# Patient Record
Sex: Male | Born: 1970
Health system: Southern US, Community
[De-identification: ages and names within clinical notes are randomized; demographics above are authoritative.]

## PROBLEM LIST (undated history)

## (undated) DIAGNOSIS — I10 Essential (primary) hypertension: Secondary | ICD-10-CM

## (undated) DIAGNOSIS — Z0000 Encounter for general adult medical examination without abnormal findings: Secondary | ICD-10-CM

## (undated) DIAGNOSIS — N529 Male erectile dysfunction, unspecified: Secondary | ICD-10-CM

## (undated) DIAGNOSIS — R9431 Abnormal electrocardiogram [ECG] [EKG]: Secondary | ICD-10-CM

## (undated) DIAGNOSIS — F1020 Alcohol dependence, uncomplicated: Secondary | ICD-10-CM

## (undated) DIAGNOSIS — N138 Other obstructive and reflux uropathy: Secondary | ICD-10-CM

## (undated) DIAGNOSIS — N401 Enlarged prostate with lower urinary tract symptoms: Secondary | ICD-10-CM

## (undated) HISTORY — DX: Essential (primary) hypertension: I10

## (undated) HISTORY — DX: Encounter for general adult medical examination without abnormal findings: Z00.00

## (undated) HISTORY — DX: Other obstructive and reflux uropathy: N13.8

## (undated) HISTORY — DX: Alcohol dependence, uncomplicated: F10.20

## (undated) HISTORY — DX: Abnormal electrocardiogram (ECG) (EKG): R94.31

## (undated) HISTORY — DX: Benign prostatic hyperplasia with lower urinary tract symptoms: N40.1

## (undated) HISTORY — DX: Male erectile dysfunction, unspecified: N52.9

---

## 2002-03-08 ENCOUNTER — Emergency Department (HOSPITAL_COMMUNITY): Admission: EM | Admit: 2002-03-08 | Discharge: 2002-03-08 | Payer: Self-pay | Admitting: Emergency Medicine

## 2008-06-06 ENCOUNTER — Emergency Department (HOSPITAL_COMMUNITY): Admission: EM | Admit: 2008-06-06 | Discharge: 2008-06-06 | Payer: Self-pay | Admitting: Emergency Medicine

## 2009-02-17 ENCOUNTER — Emergency Department (HOSPITAL_COMMUNITY): Admission: EM | Admit: 2009-02-17 | Discharge: 2009-02-17 | Payer: Self-pay | Admitting: Family Medicine

## 2009-04-19 ENCOUNTER — Ambulatory Visit: Payer: Self-pay | Admitting: Internal Medicine

## 2009-04-19 DIAGNOSIS — F528 Other sexual dysfunction not due to a substance or known physiological condition: Secondary | ICD-10-CM | POA: Insufficient documentation

## 2009-04-19 DIAGNOSIS — I10 Essential (primary) hypertension: Secondary | ICD-10-CM | POA: Insufficient documentation

## 2009-04-19 DIAGNOSIS — J45909 Unspecified asthma, uncomplicated: Secondary | ICD-10-CM | POA: Insufficient documentation

## 2009-06-24 ENCOUNTER — Ambulatory Visit: Payer: Self-pay | Admitting: Internal Medicine

## 2009-07-08 ENCOUNTER — Ambulatory Visit: Payer: Self-pay | Admitting: Internal Medicine

## 2009-07-08 DIAGNOSIS — N401 Enlarged prostate with lower urinary tract symptoms: Secondary | ICD-10-CM | POA: Insufficient documentation

## 2009-07-08 LAB — CONVERTED CEMR LAB
Albumin: 4.2 g/dL (ref 3.5–5.2)
Basophils Relative: 0.6 % (ref 0.0–3.0)
CO2: 29 meq/L (ref 19–32)
Chloride: 104 meq/L (ref 96–112)
Eosinophils Absolute: 0.2 10*3/uL (ref 0.0–0.7)
Eosinophils Relative: 5 % (ref 0.0–5.0)
GFR calc non Af Amer: 137.05 mL/min (ref >60)
Glucose, Bld: 119 mg/dL — ABNORMAL HIGH (ref 70–99)
HCT: 44.6 % (ref 39.0–52.0)
Hemoglobin, Urine: NEGATIVE
Ketones, ur: NEGATIVE mg/dL
LDL Cholesterol: 118 mg/dL — ABNORMAL HIGH (ref 0–99)
Leukocytes, UA: NEGATIVE
Lymphocytes Relative: 21.7 % (ref 12.0–46.0)
MCHC: 32.7 g/dL (ref 30.0–36.0)
Neutro Abs: 3.2 10*3/uL (ref 1.4–7.7)
Nitrite: NEGATIVE
RBC: 4.47 M/uL (ref 4.22–5.81)
TSH: 0.85 microintl units/mL (ref 0.35–5.50)
Total CHOL/HDL Ratio: 3
Total Protein, Urine: NEGATIVE mg/dL
Urine Glucose: NEGATIVE mg/dL
Urobilinogen, UA: 1 (ref 0.0–1.0)
pH: 7 (ref 5.0–8.0)

## 2009-09-16 ENCOUNTER — Emergency Department (HOSPITAL_COMMUNITY): Admission: EM | Admit: 2009-09-16 | Discharge: 2009-09-16 | Payer: Self-pay | Admitting: Emergency Medicine

## 2009-09-16 ENCOUNTER — Encounter: Payer: Self-pay | Admitting: Internal Medicine

## 2009-09-20 ENCOUNTER — Ambulatory Visit: Payer: Self-pay | Admitting: Internal Medicine

## 2009-09-20 ENCOUNTER — Telehealth: Payer: Self-pay | Admitting: Internal Medicine

## 2009-09-20 ENCOUNTER — Telehealth (INDEPENDENT_AMBULATORY_CARE_PROVIDER_SITE_OTHER): Payer: Self-pay | Admitting: *Deleted

## 2009-09-20 DIAGNOSIS — F1020 Alcohol dependence, uncomplicated: Secondary | ICD-10-CM | POA: Insufficient documentation

## 2009-09-20 DIAGNOSIS — R9431 Abnormal electrocardiogram [ECG] [EKG]: Secondary | ICD-10-CM | POA: Insufficient documentation

## 2009-09-22 ENCOUNTER — Encounter (INDEPENDENT_AMBULATORY_CARE_PROVIDER_SITE_OTHER): Payer: Self-pay | Admitting: *Deleted

## 2009-10-05 ENCOUNTER — Ambulatory Visit: Payer: Self-pay | Admitting: Cardiovascular Disease

## 2009-10-18 ENCOUNTER — Ambulatory Visit (HOSPITAL_COMMUNITY): Admission: RE | Admit: 2009-10-18 | Discharge: 2009-10-18 | Payer: Self-pay | Admitting: Cardiovascular Disease

## 2009-10-18 ENCOUNTER — Encounter: Payer: Self-pay | Admitting: Cardiovascular Disease

## 2009-10-18 ENCOUNTER — Ambulatory Visit: Payer: Self-pay

## 2009-10-18 ENCOUNTER — Ambulatory Visit: Payer: Self-pay | Admitting: Internal Medicine

## 2009-11-04 ENCOUNTER — Encounter (INDEPENDENT_AMBULATORY_CARE_PROVIDER_SITE_OTHER): Payer: Self-pay | Admitting: *Deleted

## 2009-11-25 ENCOUNTER — Ambulatory Visit: Payer: Self-pay | Admitting: Internal Medicine

## 2010-03-30 ENCOUNTER — Emergency Department (HOSPITAL_COMMUNITY): Admission: EM | Admit: 2010-03-30 | Discharge: 2010-03-30 | Payer: Self-pay | Admitting: Family Medicine

## 2010-07-14 NOTE — Assessment & Plan Note (Signed)
Summary: CPX/ WILL COME FASTING / NWS #   Vital Signs:  Patient profile:   40 year old male Height:      71 inches Weight:      195 pounds O2 Sat:      97 % on Room air Temp:     98.0 degrees F oral Pulse rate:   80 / minute Pulse rhythm:   regular Resp:     16 per minute BP sitting:   132 / 80  (right arm)  O2 Flow:  Room air  Primary Care Provider:  Etta Grandchild MD   History of Present Illness: He returns for f/up and feels well with  no complaints. Viagra is working well for  his ED.  Hypertension History:      He complains of side effects from treatment, but denies headache, chest pain, palpitations, dyspnea with exertion, orthopnea, PND, peripheral edema, visual symptoms, neurologic problems, and syncope.  He notes the following problems with antihypertensive medication side effects: ED.        Positive major cardiovascular risk factors include hypertension.  Negative major cardiovascular risk factors include male age less than 45 years old, no history of diabetes or hyperlipidemia, negative family history for ischemic heart disease, and non-tobacco-user status.        Further assessment for target organ damage reveals no history of ASHD, cardiac end-organ damage (CHF/LVH), stroke/TIA, peripheral vascular disease, renal insufficiency, or hypertensive retinopathy.     Allergies: 1)  ! Ace Inhibitors  Past History:  Past Medical History: Reviewed history from 04/19/2009 and no changes required. Asthma Hypertension ED Glaucoma  Past Surgical History: Reviewed history from 04/19/2009 and no changes required. Denies surgical history  Family History: Reviewed history from 04/19/2009 and no changes required. Family History Hypertension Family History of Stroke F 1st degree relative <60  Social History: Reviewed history from 04/19/2009 and no changes required. Occupation: Cook at [employer] Never Smoked Alcohol use-yes Drug use-no Regular exercise-yes  Review  of Systems       The patient complains of weight gain.  The patient denies anorexia, fever, weight loss, abdominal pain, melena, hematochezia, severe indigestion/heartburn, hematuria, difficulty walking, depression, enlarged lymph nodes, and testicular masses.   GU:  Complains of erectile dysfunction; denies decreased libido, discharge, dysuria, hematuria, incontinence, nocturia, urinary frequency, and urinary hesitancy.  Physical Exam  General:  alert, well-developed, well-nourished, well-hydrated, appropriate dress, normal appearance, healthy-appearing, cooperative to examination, good hygiene, and overweight-appearing.   Head:  normocephalic and atraumatic.   Eyes:  vision grossly intact, pupils equal, pupils round, and pupils reactive to light.   Mouth:  Oral mucosa and oropharynx without lesions or exudates.  Teeth in good repair. Neck:  supple, full ROM, no masses, no thyromegaly, no carotid bruits, and no cervical lymphadenopathy.   Chest Wall:  No deformities, masses, tenderness or gynecomastia noted. Breasts:  No masses or gynecomastia noted Lungs:  normal respiratory effort, no intercostal retractions, no accessory muscle use, normal breath sounds, no crackles, and no wheezes.   Heart:  normal rate, regular rhythm, no murmur, no gallop, and no rub.   Abdomen:  soft, non-tender, normal bowel sounds, no distention, no masses, no hepatomegaly, and no splenomegaly.   Rectal:  No external abnormalities noted. Normal sphincter tone. No rectal masses or tenderness. heme negative stool. Genitalia:  circumcised, no hydrocele, no varicocele, no scrotal masses, no testicular masses or atrophy, no cutaneous lesions, and no urethral discharge.   Prostate:  no nodules, no  asymmetry, no induration, and 1+ enlarged.   Msk:  normal ROM, no joint tenderness, no joint swelling, no joint warmth, no redness over joints, no joint deformities, no joint instability, and no crepitation.   Pulses:  R and L  carotid,radial,femoral,dorsalis pedis and posterior tibial pulses are full and equal bilaterally Extremities:  No clubbing, cyanosis, edema, or deformity noted with normal full range of motion of all joints.   Neurologic:  No cranial nerve deficits noted. Station and gait are normal. Plantar reflexes are down-going bilaterally. DTRs are symmetrical throughout. Sensory, motor and coordinative functions appear intact. Skin:  turgor normal, color normal, no rashes, no suspicious lesions, no ecchymoses, no petechiae, no purpura, no ulcerations, and no edema.   Cervical Nodes:  No lymphadenopathy noted Axillary Nodes:  no R axillary adenopathy and no L axillary adenopathy.   Inguinal Nodes:  no R inguinal adenopathy and no L inguinal adenopathy.   Psych:  Cognition and judgment appear intact. Alert and cooperative with normal attention span and concentration. No apparent delusions, illusions, hallucinations Additional Exam:  EKG shows NSR with minimal LVH c/w his hx. of hypertension but there are no Q waves and no ST/T wave changes.   Impression & Recommendations:  Problem # 1:  HYPERTROPHY PROSTATE W/UR OBST & OTH LUTS (ICD-600.01) Assessment New  Orders: Venipuncture (52841) TLB-Lipid Panel (80061-LIPID) TLB-BMP (Basic Metabolic Panel-BMET) (80048-METABOL) TLB-CBC Platelet - w/Differential (85025-CBCD) TLB-Hepatic/Liver Function Pnl (80076-HEPATIC) TLB-TSH (Thyroid Stimulating Hormone) (84443-TSH) TLB-Udip w/ Micro (81001-URINE) TLB-PSA (Prostate Specific Antigen) (84153-PSA)  Problem # 2:  ROUTINE GENERAL MEDICAL EXAM@HEALTH  CARE FACL (ICD-V70.0) Assessment: New  Orders: Venipuncture (32440) TLB-Lipid Panel (80061-LIPID) TLB-BMP (Basic Metabolic Panel-BMET) (80048-METABOL) TLB-CBC Platelet - w/Differential (85025-CBCD) TLB-Hepatic/Liver Function Pnl (80076-HEPATIC) TLB-TSH (Thyroid Stimulating Hormone) (84443-TSH) TLB-Udip w/ Micro (81001-URINE) TLB-PSA (Prostate Specific Antigen)  (84153-PSA) EKG w/ Interpretation (93000)  Discussed  use of alcohol, drug use, self testicular exam, routine dental care, routine eye care, routine physical exam, seat belts, multiple vitamins,  and recommendations for immunizations.  Discussed exercise and checking cholesterol.  Discussed gun safety, safe sex, and contraception. Also recommend checking PSA.  Problem # 3:  ERECTILE DYSFUNCTION (ICD-302.72) Assessment: Improved  His updated medication list for this problem includes:    Viagra 50 Mg Tabs (Sildenafil citrate) .Marland Kitchen... Take as directed  Orders: Venipuncture (10272) TLB-Lipid Panel (80061-LIPID) TLB-BMP (Basic Metabolic Panel-BMET) (80048-METABOL) TLB-CBC Platelet - w/Differential (85025-CBCD) TLB-Hepatic/Liver Function Pnl (80076-HEPATIC) TLB-TSH (Thyroid Stimulating Hormone) (84443-TSH) TLB-Udip w/ Micro (81001-URINE) TLB-PSA (Prostate Specific Antigen) (84153-PSA)  Discussed proper use of medications, as well as side effects.   Problem # 4:  HYPERTENSION (ICD-401.9) Assessment: Improved  His updated medication list for this problem includes:    Azor 5-20 Mg Tabs (Amlodipine-olmesartan) ..... Once daily for high blood pressure  Orders: Venipuncture (53664) TLB-Lipid Panel (80061-LIPID) TLB-BMP (Basic Metabolic Panel-BMET) (80048-METABOL) TLB-CBC Platelet - w/Differential (85025-CBCD) TLB-Hepatic/Liver Function Pnl (80076-HEPATIC) TLB-TSH (Thyroid Stimulating Hormone) (84443-TSH) TLB-Udip w/ Micro (81001-URINE) TLB-PSA (Prostate Specific Antigen) (84153-PSA) EKG w/ Interpretation (93000)  Complete Medication List: 1)  Azor 5-20 Mg Tabs (Amlodipine-olmesartan) .... Once daily for high blood pressure 2)  Viagra 50 Mg Tabs (Sildenafil citrate) .... Take as directed  Hypertension Assessment/Plan:      The patient's hypertensive risk group is category A: No risk factors and no target organ damage.  Today's blood pressure is 132/80.  His blood pressure goal is <  140/90.  Patient Instructions: 1)  Please schedule a follow-up appointment in 3 months. 2)  It is important that  you exercise regularly at least 20 minutes 5 times a week. If you develop chest pain, have severe difficulty breathing, or feel very tired , stop exercising immediately and seek medical attention. 3)  You need to lose weight. Consider a lower calorie diet and regular exercise.  4)  Check your Blood Pressure regularly. If it is above 140/90: you should make an appointment. Prescriptions: AZOR 5-20 MG TABS (AMLODIPINE-OLMESARTAN) once daily for high blood pressure  #30 x 11   Entered and Authorized by:   Etta Grandchild MD   Signed by:   Etta Grandchild MD on 07/08/2009   Method used:   [facility]ically to        [facility] [address] [phone]* (retail)       [address]       [place], [place]  [address]       Ph: [phone] or [phone]       Fax: [phone]   RxID:   [id number] VIAGRA 50 MG TABS (SILDENAFIL CITRATE) take as directed  #10 x 11   Entered and Authorized by:   Etta Grandchild MD   Signed by:   Etta Grandchild MD on 07/08/2009   Method used:   [facility]        [facility] [address] [phone]* (retail)       [address]       [place], [place]  [address]       Ph: [phone] or [phone]       Fax: [phone]   RxID:   [id number]

## 2010-07-14 NOTE — Progress Notes (Signed)
  Phone Note Call from Patient   Caller: Patient Summary of Call: Patient request generic allegra rx, states that this was discussed at [facility] visit today. Please advise Initial call taken by: Rock Nephew CMA,  September 20, 2009 3:07 PM  Follow-up for Phone Call        done Follow-up by: Etta Grandchild MD,  September 20, 2009 3:33 PM  Additional Follow-up for Phone Call Additional follow up Details #1::        Called home number// not able to leave VM due to phone off or pt out of service area. Called other number/ line busy x 3.Alvy Beal Archie CMA  September 20, 2009 3:39 PM    Additional Follow-up by: Rock Nephew CMA,  September 22, 2009 11:23 AM    New/Updated Medications: FEXOFENADINE HCL 180 MG TABS (FEXOFENADINE HCL) One by mouth once daily Prescriptions: FEXOFENADINE HCL 180 MG TABS (FEXOFENADINE HCL) One by mouth once daily  #30 x 11   Entered and Authorized by:   Etta Grandchild MD   Signed by:   Etta Grandchild MD on 09/20/2009   Method used:   [facility]ronically to        [facility] [address] [phone]* (retail)       [address]       [place], [place]  [address]       Ph: [phone] or [phone]       Fax: [phone]   RxID:   [id number]

## 2010-07-14 NOTE — Assessment & Plan Note (Signed)
Summary: 2 month follow up-lb   Vital Signs:  Patient profile:   40 year old male Height:      72 inches Weight:      203 pounds BMI:     27.63 O2 Sat:      97 % on Room air Temp:     98.8 degrees F oral Pulse rate:   90 / minute Pulse rhythm:   regular Resp:     16 per minute BP sitting:   128 / 86  (left arm) Cuff size:   large  Vitals Entered By: Rock Nephew CMA (November 25, 2009 9:07 AM)  Nutrition Counseling: Patient's BMI is greater than 25 and therefore counseled on weight management options.  O2 Flow:  Room air CC: follow-up visit Is Patient Diabetic? No Pain Assessment Patient in pain? no        Primary Care Provider:  Etta Grandchild MD  CC:  follow-up visit.  History of Present Illness:  Hypertension Follow-Up      This is a 40 year old man who presents for Hypertension follow-up.  The patient denies lightheadedness, urinary frequency, headaches, edema, rash, and fatigue.  The patient denies the following associated symptoms: chest pain, chest pressure, exercise intolerance, dyspnea, palpitations, syncope, leg edema, and pedal edema.  Compliance with medications (by patient report) has been near 100%.  The patient reports that dietary compliance has been fair.  The patient reports exercising occasionally.    Preventive Screening-Counseling & Management  Alcohol-Tobacco     Alcohol drinks/day: 2     Alcohol type: all     >5/day in last 3 mos: no     Alcohol Counseling: to decrease amount and/or frequency of alcohol intake     Feels need to cut down: yes     Feels annoyed by complaints: yes     Feels guilty re: drinking: yes     Needs 'eye opener' in am: no     Smoking Status: never     Passive Smoke Exposure: no  Hep-HIV-STD-Contraception     Hepatitis Risk: no risk noted     HIV Risk: no risk noted     STD Risk: no risk noted     TSE monthly: yes      Sexual History:  currently monogamous.        Drug Use:  no.        Blood Transfusions:  no.     Clinical Review Panels:  Lipid Management   Cholesterol:  195 (07/08/2009)   LDL (bad choesterol):  118 (07/08/2009)   HDL (good cholesterol):  65.90 (07/08/2009)  Diabetes Management   Creatinine:  0.8 (07/08/2009)  CBC   WBC:  5.0 (07/08/2009)   RBC:  4.47 (07/08/2009)   Hgb:  14.6 (07/08/2009)   Hct:  44.6 (07/08/2009)   Platelets:  238.0 (07/08/2009)   MCV  99.7 (07/08/2009)   MCHC  32.7 (07/08/2009)   RDW  11.0 (07/08/2009)   PMN:  63.6 (07/08/2009)   Lymphs:  21.7 (07/08/2009)   Monos:  9.1 (07/08/2009)   Eosinophils:  5.0 (07/08/2009)   Basophil:  0.6 (07/08/2009)  Complete Metabolic Panel   Glucose:  119 (07/08/2009)   Sodium:  138 (07/08/2009)   Potassium:  4.0 (07/08/2009)   Chloride:  104 (07/08/2009)   CO2:  29 (07/08/2009)   BUN:  10 (07/08/2009)   Creatinine:  0.8 (07/08/2009)   Albumin:  4.2 (07/08/2009)   Total Protein:  8.1 (07/08/2009)   Calcium:  9.3 (07/08/2009)   Total Bili:  1.3 (07/08/2009)   Alk Phos:  47 (07/08/2009)   SGPT (ALT):  55 (07/08/2009)   SGOT (AST):  52 (07/08/2009)   Medications Prior to Update: 1)  Azor 5-20 Mg Tabs (Amlodipine-Olmesartan) .... Once Daily For High Blood Pressure 2)  Viagra 50 Mg Tabs (Sildenafil Citrate) .... Take As Directed 3)  Fexofenadine Hcl 180 Mg Tabs (Fexofenadine Hcl) .... One By Mouth Once Daily (Stopped)  Current Medications (verified): 1)  Azor 5-20 Mg Tabs (Amlodipine-Olmesartan) .... Once Daily For High Blood Pressure 2)  Viagra 50 Mg Tabs (Sildenafil Citrate) .... Take As Directed 3)  Fexofenadine Hcl 180 Mg Tabs (Fexofenadine Hcl) .... One By Mouth Once Daily (Stopped)  Allergies (verified): 1)  ! Ace Inhibitors  Past History:  Past Medical History: Last updated: 10/04/2009 Asthma Hypertension ED Glaucoma  Current Problems:  ELECTROCARDIOGRAM, ABNORMAL (ICD-794.31)  09/20/09 LVH in limb leads OTH&UNSPEC ALCOHOL DEPEND EPISODIC DRUNKENNESS (ICD-303.92) HYPERTROPHY PROSTATE  W/UR OBST & OTH LUTS (ICD-600.01) ROUTINE GENERAL MEDICAL EXAM@HEALTH  CARE FACL (ICD-V70.0) ERECTILE DYSFUNCTION (ICD-302.72) HYPERTENSION (ICD-401.9) ASTHMA (ICD-493.90)  Past Surgical History: Last updated: 04/19/2009 Denies surgical history  Family History: Last updated: 04/19/2009 Family History Hypertension Family History of Stroke F 1st degree relative <60  Social History: Last updated: 10/05/2009 Occupation: [employer]ana [employer] at [employer] Never Smoked Alcohol use-yes Drug use-no Regular exercise-no   Risk Factors: Alcohol Use: 2 (11/25/2009) >5 drinks/d w/in last 3 months: no (11/25/2009) Exercise: yes (04/19/2009)  Risk Factors: Smoking Status: never (11/25/2009) Passive Smoke Exposure: no (11/25/2009)  Family History: Reviewed history from 04/19/2009 and no changes required. Family History Hypertension Family History of Stroke F 1st degree relative <60  Social History: Reviewed history from 10/05/2009 and no changes required. 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  The patient denies anorexia, fever, weight loss, syncope, peripheral edema, abdominal pain, difficulty walking, and depression.    Physical Exam  General:  alert, well-developed, well-nourished, well-hydrated, appropriate dress, normal appearance, healthy-appearing, cooperative to examination, good hygiene, and overweight-appearing.   Mouth:  Oral mucosa and oropharynx without lesions or exudates.  Teeth in good repair. Neck:  supple, full ROM, no masses, no thyromegaly, no carotid bruits, and no cervical lymphadenopathy.   Lungs:  normal respiratory effort, no intercostal retractions, no accessory muscle use, normal breath sounds, no crackles, and no wheezes.   Heart:  normal rate, regular rhythm, no murmur, no gallop, and no rub.   Abdomen:  soft, non-tender, normal bowel sounds, no  distention, no masses, no hepatomegaly, and no splenomegaly.   Msk:  normal ROM, no joint tenderness, no joint swelling, no joint warmth, no redness over joints, no joint deformities, no joint instability, and no crepitation.   Pulses:  R and L carotid,radial,femoral,dorsalis pedis and posterior tibial pulses are full and equal bilaterally Extremities:  No clubbing, cyanosis, edema, or deformity noted with normal full range of motion of all joints.   Neurologic:  No cranial nerve deficits noted. Station and gait are normal. Plantar reflexes are down-going bilaterally. DTRs are symmetrical throughout. Sensory, motor and coordinative functions appear intact. Skin:  turgor normal, color normal, no rashes, no suspicious lesions, no ecchymoses, no petechiae, no purpura, no ulcerations, and no edema.   Psych:  Cognition and judgment appear intact. Alert and cooperative with normal attention span and concentration. No apparent delusions, illusions, hallucinations  Impression & Recommendations:  Problem # 1:  HYPERTENSION (ICD-401.9) Assessment Improved  His updated medication list for this problem includes:    Azor 5-20 Mg Tabs (Amlodipine-olmesartan) ..... Once daily for high blood pressure  BP today: 128/86 Prior BP: 148/86 (10/05/2009)  Prior 10 Yr Risk Heart Disease: 4 % (09/20/2009)  Labs Reviewed: K+: 4.0 (07/08/2009) Creat: : 0.8 (07/08/2009)   Chol: 195 (07/08/2009)   HDL: 65.90 (07/08/2009)   LDL: 118 (07/08/2009)   TG: 57.0 (07/08/2009)  Complete Medication List: 1)  Azor 5-20 Mg Tabs (Amlodipine-olmesartan) .... Once daily for high blood pressure 2)  Viagra 50 Mg Tabs (Sildenafil citrate) .... Take as directed 3)  Fexofenadine Hcl 180 Mg Tabs (Fexofenadine hcl) .... One by mouth once daily (stopped)  Patient Instructions: 1)  Please schedule a follow-up appointment in 4 months. 2)  It is important that you exercise regularly at least 20 minutes 5 times a week. If you develop  chest pain, have severe difficulty breathing, or feel very tired , stop exercising immediately and seek medical attention. 3)  You need to lose weight. Consider a lower calorie diet and regular exercise.  4)  It is not healthy  for men to drink more than 2-3 drinks per day or for women to drink more than 1-2 drinks per day. 5)  Check your Blood Pressure regularly. If it is above 130/80: you should make an appointment.

## 2010-07-14 NOTE — Letter (Signed)
Summary: [facility] Consult Scheduled Letter  [facility]  [address] [place], [place] [address]   Phone: [phone]  Fax: [phone]      09/22/2009 MRN: [id number]  Thomas Berry [address] [place], [place]  [address]    Dear [name],      We have scheduled an appointment for you.  At the recommendation of Dr.Jones, we have scheduled you a consult with Dr Eden Emms on 10/05/09 at 3:45pm.  Their phone number is [phone].  If this appointment day and time is not convenient for you, please feel free to call the office of the doctor you are being referred to at the number listed above and reschedule the appointment.    [facility] [address] *Cardiology Dept.3rd Floor*    Please give 24hr notice if you need to cancel/reschedule to avoid a $50.00 fee.Also bring insurance card and any co-pay due at time of visit.     Thank you,  Patient Care Coordinator [facility]

## 2010-07-14 NOTE — Letter (Signed)
Summary: Lipid Letter  [facility]  [address] [place], [place] [address]   Phone: [phone]  Fax: [phone]    07/08/2009  Thomas Berry [address] [place], [place]  [address]  Dear Thomas Berry:  We have carefully reviewed your last lipid profile from 07/08/2009 and the results are noted below with a summary of recommendations for lipid management.    Cholesterol:       195     Goal: <200   HDL "good" Cholesterol:   41.32     Goal: >40   LDL "bad" Cholesterol:   118     Goal: <130   Triglycerides:       57.0     Goal: <150        TLC Diet (Therapeutic Lifestyle Change): Saturated Fats & Transfatty acids should be kept < 7% of total calories ***Reduce Saturated Fats Polyunstaurated Fat can be up to 10% of total calories Monounsaturated Fat Fat can be up to 20% of total calories Total Fat should be no greater than 25-35% of total calories Carbohydrates should be 50-60% of total calories Protein should be approximately 15% of total calories Fiber should be at least 20-30 grams a day ***Increased fiber may help lower LDL Total Cholesterol should be < 200mg /day Consider adding plant stanol/sterols to diet (example: Benacol spread) ***A higher intake of unsaturated fat may reduce Triglycerides and Increase HDL    Adjunctive Measures (may lower LIPIDS and reduce risk of Heart Attack) include: Aerobic Exercise (20-30 minutes 3-4 times a week) Limit Alcohol Consumption Weight Reduction Aspirin 75-81 mg a day by mouth (if not allergic or contraindicated) Dietary Fiber 20-30 grams a day by mouth     Current Medications: 1)    Azor 5-20 Mg Tabs (Amlodipine-olmesartan) .... Once daily for high blood pressure 2)    Viagra 50 Mg Tabs (Sildenafil citrate) .... Take as directed  If you have any questions, please call. We appreciate being able to work with you.   Sincerely,    [facility] Etta Grandchild MD

## 2010-07-14 NOTE — Letter (Signed)
Summary: Results Follow-up Letter  [facility]  [address] [place], [place] [address]   Phone: [phone]  Fax: [phone]    07/08/2009  [address] [place], [place]  [address]  Dear [name],   The following are the results of your recent test(s):  Test     Result     Blood sugar     slightly high Liver enzymes   elevated Kidney     normal CBC       normal Prostate     normal Thyroid     normal Urine       normal  _________________________________________________________  Please call for an appointment in 1-2 months _________________________________________________________ _________________________________________________________ _________________________________________________________  Sincerely,  Sanda Linger MD [facility]

## 2010-07-14 NOTE — Assessment & Plan Note (Signed)
Summary: np3/abn ekg/jml   Primary Provider:  Etta Grandchild MD  CC:  Rapid HR; SOB.  History of Present Illness: Thomas Berry is seen today at the request of his primary. He was in the ER recently for palpitaotns and dyspnea.  W/U was negative and he was D/C. He has longstanding HTN and his meds were recently changed.  He has a poor diet and has too much salt and sausage.  He drinks on a daily basis as well.  He denies SSCP, syncope or edema.  His ECG was abnormal with signs of LVH>  He has not had a previous  echo.  He is sedentary.  Current Problems (verified): 1)  Electrocardiogram, Abnormal  (ICD-794.31) 2)  Oth&unspec Alcohol Depend Episodic Drunkenness  (ICD-303.92) 3)  Hypertrophy Prostate W/ur Obst & Oth Luts  (ICD-600.01) 4)  Routine General Medical Exam@[facility]  (ICD-V70.0) 5)  Erectile Dysfunction  (ICD-302.72) 6)  Hypertension  (ICD-401.9) 7)  Asthma  (ICD-493.90)  Current Medications (verified): 1)  Azor 5-20 Mg Tabs (Amlodipine-Olmesartan) .... Once Daily For High Blood Pressure 2)  Viagra 50 Mg Tabs (Sildenafil Citrate) .... Take As Directed 3)  Fexofenadine Hcl 180 Mg Tabs (Fexofenadine Hcl) .... One By Mouth Once Daily (Stopped)  Allergies: 1)  ! Ace Inhibitors  Past History:  Past Medical History: Last updated: 10/04/2009 Asthma Hypertension ED Glaucoma  Current Problems:  ELECTROCARDIOGRAM, ABNORMAL (ICD-794.31)  09/20/09 LVH in limb leads OTH&UNSPEC ALCOHOL DEPEND EPISODIC DRUNKENNESS (ICD-303.92) HYPERTROPHY PROSTATE W/UR OBST & OTH LUTS (ICD-600.01) ROUTINE GENERAL MEDICAL EXAM@[facility] (ICD-V70.0) ERECTILE DYSFUNCTION (ICD-302.72) HYPERTENSION (ICD-401.9) ASTHMA (ICD-493.90)  Past Surgical History: Last updated: 04/19/2009 Denies surgical history  Family History: Last updated: 04/19/2009 Family History Hypertension Family History of Stroke F 1st degree relative <60  Social History: Last updated: 10/05/2009 Occupation: Adriana Simas at  [employer] Never Smoked Alcohol use-yes Drug use-no Regular exercise-no   Social History: 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   Vital Signs:  Patient profile:   40 year old male Height:      72 inches Weight:      207 pounds Pulse rate:   77 / minute BP sitting:   148 / 86  (left arm) Cuff size:   regular  Vitals Entered By: Stanton Kidney, EMT-P (October 05, 2009 3:41 PM)  Physical Exam  General:  Affect appropriate Healthy:  appears stated age HEENT: normal Neck supple with no adenopathy JVP normal no bruits no thyromegaly Lungs clear with no wheezing and good diaphragmatic motion Heart:  S1/S2 no murmur,rub, gallop or click PMI normal Abdomen: benighn, BS positve, no tenderness, no AAA no bruit.  No HSM or HJR Distal pulses intact with no bruits No edema Neuro non-focal Skin warm and dry    Impression & Recommendations:  Problem # 1:  ELECTROCARDIOGRAM, ABNORMAL (ICD-794.31) LVH from poorly controlled BP  F/U echo to R/O CE, assess degree of LVH and make sure EF normal His updated medication list for this problem includes:    Azor 5-20 Mg Tabs (Amlodipine-olmesartan) ..... Once daily for high blood pressure  Orders: Echocardiogram (Echo)  Problem # 2:  HYPERTENSION (ICD-401.9) Stressed importance of improved diet and decreased ETOH and salt consumption.  Continue Azor His updated  medication list for this problem includes:    Azor 5-20 Mg Tabs (Amlodipine-olmesartan) ..... Once daily for high blood pressure  Problem # 3:  PALPITATIONS (ICD-785.1) ? Panic attack related to ETOH.  NO arrythmia on tele in ER.  Can be prone to atrial arrythmias with HTN and ETOH.  Event monitor if  recurs or echo abnormal His updated medication list for this problem includes:    Azor 5-20 Mg Tabs (Amlodipine-olmesartan) ..... Once daily for high blood pressure  Patient Instructions: 1)  Your physician recommends that you schedule a follow-up appointment in: AS NEEDED PENDING TEST RESULTS 2)  Your physician has requested that you have an echocardiogram.  Echocardiography is a painless test that uses sound waves to create images of your heart. It provides your doctor with information about the size and shape of your heart and how well your heart's chambers and valves are working.  This procedure takes approximately one hour. There are no restrictions for this procedure.

## 2010-07-14 NOTE — Progress Notes (Signed)
----   Converted from flag ---- ---- 09/20/2009 3:00 PM, Edman Circle wrote: appt 4/26 @ 3:45 with Dr Eden Emms  ---- 09/20/2009 2:46 PM, Dagoberto Reef wrote: Thanks  ---- 09/20/2009 2:03 PM, Dagoberto Reef wrote:   ---- 09/20/2009 1:42 PM, Etta Grandchild MD wrote: The following orders have been entered for this patient and placed on Admin Hold:  Type:     Referral       Code:   Psychology Description:   Psychology Referral Order Date:   09/20/2009   Authorized By:   Etta Grandchild MD Order #:   [id number] Clinical Notes: ------------------------------

## 2010-07-14 NOTE — Assessment & Plan Note (Signed)
Summary: post hosp/#/cd   Vital Signs:  Patient profile:   40 year old male Height:      71 inches Weight:      198 pounds BMI:     27.72 O2 Sat:      97 % on Room air Temp:     98.6 degrees F oral Pulse rate:   92 / minute Pulse rhythm:   regular BP sitting:   112 / 70  (left arm) Cuff size:   large  Vitals Entered By: Rock Nephew CMA (September 20, 2009 1:24 PM)  O2 Flow:  Room air CC: hospital follow up, Hypertension Management   Primary Care Provider:  Etta Grandchild MD  CC:  hospital follow up and Hypertension Management.  History of Present Illness: He returns for f/up on palpitations that he has had off/on since 61 yoa. He had a death of a 42 yo relative when he was 63 and has used alcohol to deal with stress since then. When he drinks he will have palpitations and SOB. He went to the ER 5 days ago and he ruled out for iscjemia, etc. but they were concerned about his alcohol abuse. He has had no alcohol for 5 days now but has been taking chlordiazepoxide for withdrawl symptoms.  Hypertension History:      He complains of palpitations, but denies headache, chest pain, dyspnea with exertion, orthopnea, PND, peripheral edema, visual symptoms, neurologic problems, syncope, and side effects from treatment.  He notes no problems with any antihypertensive medication side effects.        Positive major cardiovascular risk factors include hypertension.  Negative major cardiovascular risk factors include male age less than 53 years old, no history of diabetes or hyperlipidemia, negative family history for ischemic heart disease, and non-tobacco-user status.        Further assessment for target organ damage reveals no history of ASHD, cardiac end-organ damage (CHF/LVH), stroke/TIA, peripheral vascular disease, renal insufficiency, or hypertensive retinopathy.     Preventive Screening-Counseling & Management  Alcohol-Tobacco     Alcohol drinks/day: >5     Alcohol type: all  >5/day in last 3 mos: yes     Alcohol Counseling: to STOP drinking     Feels need to cut down: yes     Feels annoyed by complaints: yes     Feels guilty re: drinking: yes     Needs 'eye opener' in am: no     Smoking Status: never  Current Medications (verified): 1)  Azor 5-20 Mg Tabs (Amlodipine-Olmesartan) .... Once Daily For High Blood Pressure 2)  Viagra 50 Mg Tabs (Sildenafil Citrate) .... Take As Directed 3)  Chlordiazepoxide Hcl 25 Mg Caps (Chlordiazepoxide Hcl)  Allergies (verified): 1)  ! Ace Inhibitors  Past History:  Past Medical History: Reviewed history from 04/19/2009 and no changes required. Asthma Hypertension ED Glaucoma  Past Surgical History: Reviewed history from 04/19/2009 and no changes required. Denies surgical history  Family History: Reviewed history from 04/19/2009 and no changes required. Family History Hypertension Family History of Stroke F 1st degree relative <60  Social History: Reviewed history from 04/19/2009 and no changes required. Occupation: Cook at [employer] Never Smoked Alcohol use-yes Drug use-no Regular exercise-yes  Review of Systems  The patient denies anorexia, weight loss, syncope, peripheral edema, prolonged cough, headaches, hemoptysis, abdominal pain, hematuria, and depression.   Psych:  Complains of anxiety; denies depression, easily angered, easily tearful, irritability, mental problems, panic attacks, sense of great danger, suicidal thoughts/plans,  thoughts of violence, unusual visions or sounds, and thoughts /plans of harming others.  Physical Exam  General:  alert, well-developed, well-nourished, well-hydrated, appropriate dress, normal appearance, healthy-appearing, cooperative to examination, good hygiene, and overweight-appearing.   Head:  normocephalic and atraumatic.   Mouth:  Oral mucosa and oropharynx without lesions or exudates.  Teeth in good repair. Neck:  supple, full ROM, no masses, no thyromegaly, no  carotid bruits, and no cervical lymphadenopathy.   Lungs:  normal respiratory effort, no intercostal retractions, no accessory muscle use, normal breath sounds, no crackles, and no wheezes.   Heart:  normal rate, regular rhythm, no murmur, no gallop, and no rub.   Abdomen:  soft, non-tender, normal bowel sounds, no distention, no masses, no hepatomegaly, and no splenomegaly.   Msk:  normal ROM, no joint tenderness, no joint swelling, no joint warmth, no redness over joints, no joint deformities, no joint instability, and no crepitation.   Extremities:  No clubbing, cyanosis, edema, or deformity noted with normal full range of motion of all joints.   Psych:  Cognition and judgment appear intact. Alert and cooperative with normal attention span and concentration. No apparent delusions, illusions, hallucinations Additional Exam:  EKG today shows LVH and inverted T waves in II,III,and AVF which look like reciprocal changes and are unchanged from the EKG done last week at [facility]. He has NSR, rate=88 bpm.   Impression & Recommendations:  Problem # 1:  ELECTROCARDIOGRAM, ABNORMAL (ICD-794.31) Assessment New  Orders: Cardiology Referral (Cardiology) EKG w/ Interpretation (93000)  Problem # 2:  OTH&UNSPEC ALCOHOL DEPEND EPISODIC DRUNKENNESS (ICD-303.92) Assessment: New i recommended that he start attending AA meetings and start psychotherapy. Orders: Psychology Referral (Psychology) EKG w/ Interpretation (93000)  Problem # 3:  HYPERTENSION (ICD-401.9) Assessment: Improved  His updated medication list for this problem includes:    Azor 5-20 Mg Tabs (Amlodipine-olmesartan) ..... Once daily for high blood pressure  BP today: 112/70 Prior BP: 132/80 (07/08/2009)  10 Yr Risk Heart Disease: 4 % Prior 10 Yr Risk Heart Disease: Not enough information (04/19/2009)  Labs Reviewed: K+: 4.0 (07/08/2009) Creat: : 0.8 (07/08/2009)   Chol: 195 (07/08/2009)   HDL: 65.90 (07/08/2009)   LDL: 118  (07/08/2009)   TG: 57.0 (07/08/2009)  Orders: EKG w/ Interpretation (93000)  Complete Medication List: 1)  Azor 5-20 Mg Tabs (Amlodipine-olmesartan) .... Once daily for high blood pressure 2)  Viagra 50 Mg Tabs (Sildenafil citrate) .... Take as directed 3)  Chlordiazepoxide Hcl 25 Mg Caps (Chlordiazepoxide hcl)  Hypertension Assessment/Plan:      The patient's hypertensive risk group is category A: No risk factors and no target organ damage.  His calculated 10 year risk of coronary heart disease is 4 %.  Today's blood pressure is 112/70.  His blood pressure goal is < 140/90.  Patient Instructions: 1)  Please schedule a follow-up appointment in 2 months. 2)  It is important that you exercise regularly at least 20 minutes 5 times a week. If you develop chest pain, have severe difficulty breathing, or feel very tired , stop exercising immediately and seek medical attention. 3)  You need to lose weight. Consider a lower calorie diet and regular exercise.  4)  It is not healthy  for men to drink more than 2-3 drinks per day or for women to drink more than 1-2 drinks per day. 5)  Check your Blood Pressure regularly. If it is above 130/80: you should make an appointment.

## 2010-07-14 NOTE — Letter (Signed)
Summary: Generic Letter  Architectural technologist, Main Office  [address]   [place], [place] [address]   Phone: [phone]  Fax: [phone]        Nov 04, 2009 MRN: [id number]    [facility] [address] [place], [place]  [address]    Dear Mr. Villavicencio,        The ultrasound you had in our office was normal. That means the heart pumping function and the heart structure is normal. Please call with questions or concerns.    Sincerely,  Deliah Goody, RN/Dr Charlton Haws  This letter has been electronically signed by your physician.

## 2010-08-31 ENCOUNTER — Encounter: Payer: Self-pay | Admitting: Internal Medicine

## 2010-08-31 ENCOUNTER — Ambulatory Visit (INDEPENDENT_AMBULATORY_CARE_PROVIDER_SITE_OTHER): Payer: 59 | Admitting: Internal Medicine

## 2010-08-31 VITALS — BP 162/96 | HR 92 | Temp 99.0°F | Resp 14 | Wt 192.2 lb

## 2010-08-31 DIAGNOSIS — I10 Essential (primary) hypertension: Secondary | ICD-10-CM | POA: Insufficient documentation

## 2010-08-31 DIAGNOSIS — J309 Allergic rhinitis, unspecified: Secondary | ICD-10-CM

## 2010-08-31 LAB — DIFFERENTIAL
Basophils Absolute: 0 10*3/uL (ref 0.0–0.1)
Basophils Relative: 0 % (ref 0–1)
Lymphocytes Relative: 22 % (ref 12–46)
Lymphs Abs: 1.6 10*3/uL (ref 0.7–4.0)
Monocytes Absolute: 0.6 10*3/uL (ref 0.1–1.0)
Monocytes Relative: 8 % (ref 3–12)
Neutro Abs: 5 10*3/uL (ref 1.7–7.7)
Neutrophils Relative %: 68 % (ref 43–77)

## 2010-08-31 LAB — POCT CARDIAC MARKERS
CKMB, poc: 1.1 ng/mL (ref 1.0–8.0)
CKMB, poc: <1 ng/mL — ABNORMAL LOW (ref 1.0–8.0)
Myoglobin, poc: 63.7 ng/mL (ref 12–200)
Myoglobin, poc: 65.3 ng/mL (ref 12–200)
Troponin i, poc: <0.05 ng/mL (ref 0.00–0.09)
Troponin i, poc: <0.05 ng/mL (ref 0.00–0.09)

## 2010-08-31 LAB — COMPREHENSIVE METABOLIC PANEL
Albumin: 4.5 g/dL (ref 3.5–5.2)
Chloride: 105 mEq/L (ref 96–112)
Creatinine, Ser: 0.89 mg/dL (ref 0.4–1.5)
GFR calc Af Amer: >60 mL/min (ref >60)
Sodium: 140 mEq/L (ref 135–145)

## 2010-08-31 LAB — RAPID URINE DRUG SCREEN, HOSP PERFORMED: Amphetamines: NOT DETECTED

## 2010-08-31 LAB — CBC
MCHC: 34.3 g/dL (ref 30.0–36.0)
MCV: 99.2 fL (ref 78.0–100.0)
RBC: 4.47 MIL/uL (ref 4.22–5.81)
RDW: 12.1 % (ref 11.5–15.5)

## 2010-08-31 LAB — PROTIME-INR: INR: 1.03 (ref 0.00–1.49)

## 2010-08-31 MED ORDER — FEXOFENADINE HCL 180 MG PO TABS: 180.0000 mg | ORAL_TABLET | Freq: Every day | ORAL | Status: DC

## 2010-08-31 MED ORDER — OLMESARTAN MEDOXOMIL-HCTZ 40-12.5 MG PO TABS: 1.0000 | ORAL_TABLET | Freq: Every day | ORAL | Status: DC

## 2010-08-31 NOTE — Progress Notes (Signed)
  Subjective:    Patient ID: Thomas Berry, male    DOB: [birth year], 40 y.o.   MRN: [id number]  Hypertension This is a chronic problem. The current episode started more than 1 year ago. The problem has been waxing and waning since onset. The problem is uncontrolled. Pertinent negatives include no anxiety, blurred vision, chest pain, headaches, malaise/fatigue, neck pain, orthopnea, palpitations, peripheral edema, PND, shortness of breath or sweats. There are no associated agents to hypertension. Risk factors for coronary artery disease include male gender. Past treatments include calcium channel blockers and angiotensin blockers. The current treatment provides no improvement. Compliance problems include medication side effects.  There is no history of angina, kidney disease, heart failure, left ventricular hypertrophy, PVD or retinopathy.      Review of Systems  Constitutional: Negative for fever, chills, malaise/fatigue, activity change, appetite change, fatigue and unexpected weight change.  HENT: Positive for congestion, rhinorrhea, sneezing and postnasal drip. Negative for ear pain, nosebleeds, facial swelling, neck pain, neck stiffness, tinnitus and ear discharge.   Eyes: Positive for redness. Negative for blurred vision, pain, discharge and itching.  Respiratory: Negative for apnea, cough, choking, chest tightness, shortness of breath, wheezing and stridor.   Cardiovascular: Negative for chest pain, palpitations, orthopnea and PND.  Gastrointestinal: Negative for abdominal pain.  Musculoskeletal: Negative for myalgias, back pain, joint swelling, arthralgias and gait problem.  Skin: Negative for color change, pallor and rash.  Neurological: Negative for headaches.  Hematological: Negative for adenopathy. Does not bruise/bleed easily.  Psychiatric/Behavioral: Negative for hallucinations, behavioral problems, confusion, dysphoric mood, decreased concentration and agitation.         Objective:   Physical Exam  Constitutional: He is oriented to person, place, and time. He appears well-developed and well-nourished. No distress.  HENT:  Head: Normocephalic and atraumatic.  Right Ear: External ear normal.  Left Ear: External ear normal.  Nose: Nose normal.  Mouth/Throat: No oropharyngeal exudate.  Eyes: Conjunctivae and EOM are normal. Pupils are equal, round, and reactive to light. Right eye exhibits no discharge. Left eye exhibits discharge. Scleral icterus is present.  Neck: Normal range of motion. Neck supple. No thyromegaly present.  Cardiovascular: Normal rate, regular rhythm, normal heart sounds and intact distal pulses.  Exam reveals no gallop and no friction rub.   No murmur heard. Pulmonary/Chest: No respiratory distress. He has no wheezes. He has no rales. He exhibits no tenderness.  Abdominal: He exhibits no distension. There is no tenderness. There is no rebound and no guarding.  Musculoskeletal: He exhibits no edema and no tenderness.  Lymphadenopathy:    He has no cervical adenopathy.  Neurological: He is alert and oriented to person, place, and time. He has normal reflexes. He displays normal reflexes. No cranial nerve deficit. He exhibits normal muscle tone. Coordination normal.  Skin: Skin is warm and dry. No rash noted. He is not diaphoretic. No erythema. No pallor.  Psychiatric: He has a normal mood and affect. His behavior is normal. Thought content normal.          Assessment & Plan:

## 2010-08-31 NOTE — Patient Instructions (Signed)

## 2010-11-08 ENCOUNTER — Encounter: Payer: Self-pay | Admitting: Internal Medicine

## 2010-11-09 ENCOUNTER — Ambulatory Visit (INDEPENDENT_AMBULATORY_CARE_PROVIDER_SITE_OTHER): Payer: 59 | Admitting: Internal Medicine

## 2010-11-09 ENCOUNTER — Encounter: Payer: Self-pay | Admitting: Internal Medicine

## 2010-11-09 DIAGNOSIS — I10 Essential (primary) hypertension: Secondary | ICD-10-CM

## 2010-11-09 MED ORDER — OLMESARTAN MEDOXOMIL-HCTZ 40-12.5 MG PO TABS: 1.0000 | ORAL_TABLET | Freq: Every day | ORAL | Status: DC

## 2010-11-09 NOTE — Progress Notes (Signed)
  Subjective:    Patient ID: Thomas Berry, male    DOB: 1970/07/31, 40 y.o.   MRN: [id number]  Hypertension This is a chronic problem. The current episode started more than 1 year ago. The problem has been gradually improving since onset. The problem is controlled. Pertinent negatives include no anxiety, blurred vision, chest pain, headaches, malaise/fatigue, neck pain, orthopnea, palpitations, peripheral edema, PND, shortness of breath or sweats. There are no associated agents to hypertension. Past treatments include angiotensin blockers and diuretics. The current treatment provides significant improvement. Compliance problems include exercise and diet.       Review of Systems  Constitutional: Negative for fever, chills, malaise/fatigue, diaphoresis, activity change, appetite change, fatigue and unexpected weight change.  HENT: Negative for facial swelling, trouble swallowing, neck pain, neck stiffness and voice change.   Eyes: Negative for blurred vision, photophobia and visual disturbance.  Respiratory: Negative for cough, shortness of breath, wheezing and stridor.   Cardiovascular: Negative for chest pain, palpitations, orthopnea, leg swelling and PND.  Gastrointestinal: Negative for nausea, vomiting, abdominal pain, diarrhea and constipation.  Genitourinary: Negative for dysuria, urgency, frequency, hematuria, decreased urine volume, enuresis and difficulty urinating.  Musculoskeletal: Negative for myalgias, back pain, joint swelling, arthralgias and gait problem.  Skin: Negative for color change, pallor and rash.  Neurological: Negative for dizziness, tremors, seizures, syncope, facial asymmetry, speech difficulty, weakness, light-headedness, numbness and headaches.  Hematological: Negative for adenopathy. Does not bruise/bleed easily.  Psychiatric/Behavioral: Negative.        Objective:   Physical Exam  Vitals reviewed. Constitutional: He is oriented to person, place, and time.  He appears well-developed and well-nourished. No distress.  HENT:  Head: Normocephalic and atraumatic.  Right Ear: External ear normal.  Left Ear: External ear normal.  Nose: Nose normal.  Mouth/Throat: Oropharynx is clear and moist. No oropharyngeal exudate.  Eyes: Conjunctivae and EOM are normal. Pupils are equal, round, and reactive to light.  Neck: Normal range of motion. Neck supple. No JVD present. No tracheal deviation present.  Cardiovascular: Normal rate, regular rhythm, normal heart sounds and intact distal pulses.  Exam reveals no gallop and no friction rub.   No murmur heard. Pulmonary/Chest: Effort normal and breath sounds normal. No stridor. No respiratory distress. He has no wheezes. He has no rales. He exhibits no tenderness.  Abdominal: Soft. Bowel sounds are normal. He exhibits no distension and no mass. There is no tenderness. There is no rebound and no guarding.  Musculoskeletal: Normal range of motion. He exhibits no edema and no tenderness.  Lymphadenopathy:    He has no cervical adenopathy.  Neurological: He is alert and oriented to person, place, and time. He has normal reflexes. No cranial nerve deficit.  Skin: Skin is warm and dry. No rash noted. He is not diaphoretic. No erythema. No pallor.  Psychiatric: He has a normal mood and affect. His behavior is normal. Judgment and thought content normal.          Assessment & Plan:

## 2010-11-09 NOTE — Assessment & Plan Note (Signed)
He feels well and his BP is well controlled so I will ask him to continue his current regimen

## 2010-11-09 NOTE — Patient Instructions (Signed)

## 2011-05-10 ENCOUNTER — Ambulatory Visit: Payer: 59 | Admitting: Internal Medicine

## 2011-05-24 ENCOUNTER — Other Ambulatory Visit (INDEPENDENT_AMBULATORY_CARE_PROVIDER_SITE_OTHER): Payer: 59

## 2011-05-24 ENCOUNTER — Encounter: Payer: Self-pay | Admitting: Internal Medicine

## 2011-05-24 ENCOUNTER — Ambulatory Visit (INDEPENDENT_AMBULATORY_CARE_PROVIDER_SITE_OTHER): Payer: 59 | Admitting: Internal Medicine

## 2011-05-24 DIAGNOSIS — R7309 Other abnormal glucose: Secondary | ICD-10-CM

## 2011-05-24 DIAGNOSIS — Z23 Encounter for immunization: Secondary | ICD-10-CM

## 2011-05-24 DIAGNOSIS — R739 Hyperglycemia, unspecified: Secondary | ICD-10-CM | POA: Insufficient documentation

## 2011-05-24 DIAGNOSIS — I10 Essential (primary) hypertension: Secondary | ICD-10-CM

## 2011-05-24 LAB — COMPREHENSIVE METABOLIC PANEL
ALT: 42 U/L (ref 0–53)
Albumin: 4.3 g/dL (ref 3.5–5.2)
BUN: 13 mg/dL (ref 6–23)
Calcium: 8.9 mg/dL (ref 8.4–10.5)
Creatinine, Ser: 1 mg/dL (ref 0.4–1.5)
GFR: 111.38 mL/min (ref >60.00)
Glucose, Bld: 111 mg/dL — ABNORMAL HIGH (ref 70–99)
Total Bilirubin: 1 mg/dL (ref 0.3–1.2)

## 2011-05-24 LAB — CBC WITH DIFFERENTIAL/PLATELET
Basophils Relative: 0.4 % (ref 0.0–3.0)
Eosinophils Relative: 4.7 % (ref 0.0–5.0)
HCT: 42.9 % (ref 39.0–52.0)
Hemoglobin: 15.2 g/dL (ref 13.0–17.0)
MCV: 97.9 fl (ref 78.0–100.0)
RBC: 4.38 Mil/uL (ref 4.22–5.81)
RDW: 11.8 % (ref 11.5–14.6)

## 2011-05-24 LAB — HEMOGLOBIN A1C: Hgb A1c MFr Bld: 4.7 % (ref 4.6–6.5)

## 2011-05-24 MED ORDER — OLMESARTAN MEDOXOMIL-HCTZ 40-12.5 MG PO TABS: 1.0000 | ORAL_TABLET | Freq: Every day | ORAL | Status: DC

## 2011-05-24 NOTE — Progress Notes (Signed)
  Subjective:    Patient ID: Thomas Berry, male    DOB: 05-20-1971, 40 y.o.   MRN: [id number]  Hypertension This is a chronic problem. The current episode started more than 1 year ago. The problem has been gradually improving since onset. The problem is controlled. Pertinent negatives include no anxiety, blurred vision, chest pain, headaches, malaise/fatigue, neck pain, orthopnea, palpitations, peripheral edema, PND, shortness of breath or sweats. There are no associated agents to hypertension. Past treatments include angiotensin blockers and diuretics. The current treatment provides significant improvement. Compliance problems include exercise and diet.       Review of Systems  Constitutional: Negative for fever, chills, malaise/fatigue, diaphoresis, activity change, appetite change, fatigue and unexpected weight change.  HENT: Negative for sore throat, trouble swallowing, neck pain and voice change.   Eyes: Negative.  Negative for blurred vision.  Respiratory: Negative for cough, chest tightness, shortness of breath, wheezing and stridor.   Cardiovascular: Negative for chest pain, palpitations, orthopnea and PND.  Gastrointestinal: Negative for nausea, abdominal pain, diarrhea, constipation, blood in stool, abdominal distention, anal bleeding and rectal pain.  Genitourinary: Negative for dysuria, urgency, frequency, hematuria, flank pain, decreased urine volume, enuresis and difficulty urinating.  Musculoskeletal: Negative.   Skin: Negative for color change, pallor, rash and wound.  Neurological: Negative.  Negative for headaches.  Hematological: Negative for adenopathy. Does not bruise/bleed easily.  Psychiatric/Behavioral: Negative.        Objective:   Physical Exam  Vitals reviewed. Constitutional: He is oriented to person, place, and time. He appears well-developed and well-nourished. No distress.  HENT:  Head: Normocephalic and atraumatic.  Mouth/Throat: Oropharynx is clear  and moist. No oropharyngeal exudate.  Eyes: Conjunctivae are normal. Right eye exhibits no discharge. Left eye exhibits no discharge. No scleral icterus.  Neck: Normal range of motion. Neck supple. No JVD present. No tracheal deviation present. No thyromegaly present.  Cardiovascular: Normal rate, regular rhythm, normal heart sounds and intact distal pulses.  Exam reveals no gallop and no friction rub.   No murmur heard. Pulmonary/Chest: Effort normal and breath sounds normal. No stridor. No respiratory distress. He has no wheezes. He has no rales. He exhibits no tenderness.  Abdominal: Soft. Bowel sounds are normal. He exhibits no distension and no mass. There is no tenderness. There is no rebound and no guarding.  Musculoskeletal: Normal range of motion. He exhibits no edema and no tenderness.  Lymphadenopathy:    He has no cervical adenopathy.  Neurological: He is oriented to person, place, and time.  Skin: Skin is warm and dry. No rash noted. He is not diaphoretic. No erythema. No pallor.  Psychiatric: He has a normal mood and affect. His behavior is normal. Judgment and thought content normal.         Assessment & Plan:

## 2011-05-24 NOTE — Assessment & Plan Note (Signed)
His BP is adequately controlled, I will check his lytes and renal function today

## 2011-05-24 NOTE — Patient Instructions (Signed)

## 2011-05-24 NOTE — Assessment & Plan Note (Signed)
I will recheck his blood sugar and will look at an a1c as well

## 2012-06-24 ENCOUNTER — Other Ambulatory Visit: Payer: Self-pay | Admitting: Internal Medicine

## 2012-06-27 ENCOUNTER — Telehealth: Payer: Self-pay | Admitting: Internal Medicine

## 2012-06-27 DIAGNOSIS — I10 Essential (primary) hypertension: Secondary | ICD-10-CM

## 2012-06-27 MED ORDER — OLMESARTAN MEDOXOMIL-HCTZ 40-12.5 MG PO TABS: 1.0000 | ORAL_TABLET | Freq: Every day | ORAL | Status: DC

## 2012-06-27 NOTE — Telephone Encounter (Signed)
done

## 2012-06-27 NOTE — Telephone Encounter (Signed)
Requesting refill on BP medicine.  He is out and didn't have any today.  He has made an appt for Monday Jan 20 for a physical.

## 2012-06-27 NOTE — Telephone Encounter (Signed)
Called pt no personal voice mail, pharmacy to notify

## 2012-07-01 ENCOUNTER — Ambulatory Visit (INDEPENDENT_AMBULATORY_CARE_PROVIDER_SITE_OTHER): Payer: 59 | Admitting: Internal Medicine

## 2012-07-01 ENCOUNTER — Encounter: Payer: Self-pay | Admitting: Internal Medicine

## 2012-07-01 ENCOUNTER — Other Ambulatory Visit (INDEPENDENT_AMBULATORY_CARE_PROVIDER_SITE_OTHER): Payer: 59

## 2012-07-01 VITALS — BP 140/90 | HR 90 | Temp 98.9°F | Resp 16 | Wt 168.0 lb

## 2012-07-01 DIAGNOSIS — L309 Dermatitis, unspecified: Secondary | ICD-10-CM

## 2012-07-01 DIAGNOSIS — Z0000 Encounter for general adult medical examination without abnormal findings: Secondary | ICD-10-CM

## 2012-07-01 DIAGNOSIS — H01139 Eczematous dermatitis of unspecified eye, unspecified eyelid: Secondary | ICD-10-CM | POA: Insufficient documentation

## 2012-07-01 DIAGNOSIS — B351 Tinea unguium: Secondary | ICD-10-CM | POA: Insufficient documentation

## 2012-07-01 DIAGNOSIS — I10 Essential (primary) hypertension: Secondary | ICD-10-CM | POA: Insufficient documentation

## 2012-07-01 DIAGNOSIS — L259 Unspecified contact dermatitis, unspecified cause: Secondary | ICD-10-CM

## 2012-07-01 LAB — CBC WITH DIFFERENTIAL/PLATELET
Basophils Absolute: 0.1 10*3/uL (ref 0.0–0.1)
Eosinophils Absolute: 0.1 10*3/uL (ref 0.0–0.7)
HCT: 43.8 % (ref 39.0–52.0)
Lymphs Abs: 1.4 10*3/uL (ref 0.7–4.0)
Monocytes Absolute: 0.6 10*3/uL (ref 0.1–1.0)
Monocytes Relative: 6.4 % (ref 3.0–12.0)
Platelets: 291 10*3/uL (ref 150.0–400.0)
RDW: 11.5 % (ref 11.5–14.6)

## 2012-07-01 LAB — LIPID PANEL
HDL: 74.7 mg/dL (ref >39.00)
LDL Cholesterol: 102 mg/dL — ABNORMAL HIGH (ref 0–99)
Total CHOL/HDL Ratio: 3
Triglycerides: 81 mg/dL (ref 0.0–149.0)

## 2012-07-01 LAB — COMPREHENSIVE METABOLIC PANEL
ALT: 38 U/L (ref 0–53)
AST: 41 U/L — ABNORMAL HIGH (ref 0–37)
Alkaline Phosphatase: 65 U/L (ref 39–117)
CO2: 29 mEq/L (ref 19–32)
Sodium: 137 mEq/L (ref 135–145)
Total Bilirubin: 1 mg/dL (ref 0.3–1.2)
Total Protein: 8.3 g/dL (ref 6.0–8.3)

## 2012-07-01 LAB — URINALYSIS, ROUTINE W REFLEX MICROSCOPIC
Bilirubin Urine: NEGATIVE
Hgb urine dipstick: NEGATIVE
Urine Glucose: NEGATIVE

## 2012-07-01 LAB — PSA: PSA: 0.5 ng/mL (ref 0.10–4.00)

## 2012-07-01 MED ORDER — OLMESARTAN MEDOXOMIL-HCTZ 40-12.5 MG PO TABS: 1.0000 | ORAL_TABLET | Freq: Every day | ORAL | Status: DC

## 2012-07-01 MED ORDER — TERBINAFINE HCL 250 MG PO TABS: 250.0000 mg | ORAL_TABLET | Freq: Every day | ORAL | Status: DC

## 2012-07-01 MED ORDER — TRIAMCINOLONE ACETONIDE 0.5 % EX CREA: TOPICAL_CREAM | Freq: Three times a day (TID) | CUTANEOUS | Status: DC

## 2012-07-01 NOTE — Assessment & Plan Note (Signed)
He has adequate BP control Today I will check his lytes and renal function 

## 2012-07-01 NOTE — Patient Instructions (Addendum)
Health Maintenance, Males A healthy lifestyle and preventative care can promote health and wellness.  Maintain regular health, dental, and eye exams.  Eat a healthy diet. Foods like vegetables, fruits, whole grains, low-fat dairy products, and lean protein foods contain the nutrients you need without too many calories. Decrease your intake of foods high in solid fats, added sugars, and salt. Get information about a proper diet from your caregiver, if necessary.  Regular physical exercise is one of the most important things you can do for your health. Most adults should get at least 150 minutes of moderate-intensity exercise (any activity that increases your heart rate and causes you to sweat) each week. In addition, most adults need muscle-strengthening exercises on 2 or more days a week.   Maintain a healthy weight. The body mass index (BMI) is a screening tool to identify possible weight problems. It provides an estimate of body fat based on height and weight. Your caregiver can help determine your BMI, and can help you achieve or maintain a healthy weight. For adults 20 years and older:  A BMI below 18.5 is considered underweight.  A BMI of 18.5 to 24.9 is normal.  A BMI of 25 to 29.9 is considered overweight.  A BMI of 30 and above is considered obese.  Maintain normal blood lipids and cholesterol by exercising and minimizing your intake of saturated fat. Eat a balanced diet with plenty of fruits and vegetables. Blood tests for lipids and cholesterol should begin at age 20 and be repeated every 5 years. If your lipid or cholesterol levels are high, you are over 50, or you are a high risk for heart disease, you may need your cholesterol levels checked more frequently.Ongoing high lipid and cholesterol levels should be treated with medicines, if diet and exercise are not effective.  If you smoke, find out from your caregiver how to quit. If you do not use tobacco, do not start.  If you  choose to drink alcohol, do not exceed 2 drinks per day. One drink is considered to be 12 ounces (355 mL) of beer, 5 ounces (148 mL) of wine, or 1.5 ounces (44 mL) of liquor.  Avoid use of street drugs. Do not share needles with anyone. Ask for help if you need support or instructions about stopping the use of drugs.  High blood pressure causes heart disease and increases the risk of stroke. Blood pressure should be checked at least every 1 to 2 years. Ongoing high blood pressure should be treated with medicines if weight loss and exercise are not effective.  If you are 45 to 44 years old, ask your caregiver if you should take aspirin to prevent heart disease.  Diabetes screening involves taking a blood sample to check your fasting blood sugar level. This should be done once every 3 years, after age 45, if you are within normal weight and without risk factors for diabetes. Testing should be considered at a younger age or be carried out more frequently if you are overweight and have at least 1 risk factor for diabetes.  Colorectal cancer can be detected and often prevented. Most routine colorectal cancer screening begins at the age of 50 and continues through age 75. However, your caregiver may recommend screening at an earlier age if you have risk factors for colon cancer. On a yearly basis, your caregiver may provide home test kits to check for hidden blood in the stool. Use of a small camera at the end of a tube,   to directly examine the colon (sigmoidoscopy or colonoscopy), can detect the earliest forms of colorectal cancer. Talk to your caregiver about this at age 50, when routine screening begins. Direct examination of the colon should be repeated every 5 to 10 years through age 75, unless early forms of pre-cancerous polyps or small growths are found.  Hepatitis C blood testing is recommended for all people born from 1945 through 1965 and any individual with known risks for hepatitis C.  Healthy  men should no longer receive prostate-specific antigen (PSA) blood tests as part of routine cancer screening. Consult with your caregiver about prostate cancer screening.  Testicular cancer screening is not recommended for adolescents or adult males who have no symptoms. Screening includes self-exam, caregiver exam, and other screening tests. Consult with your caregiver about any symptoms you have or any concerns you have about testicular cancer.  Practice safe sex. Use condoms and avoid high-risk sexual practices to reduce the spread of sexually transmitted infections (STIs).  Use sunscreen with a sun protection factor (SPF) of 30 or greater. Apply sunscreen liberally and repeatedly throughout the day. You should seek shade when your shadow is shorter than you. Protect yourself by wearing long sleeves, pants, a wide-brimmed hat, and sunglasses year round, whenever you are outdoors.  Notify your caregiver of new moles or changes in moles, especially if there is a change in shape or color. Also notify your caregiver if a mole is larger than the size of a pencil eraser.  A one-time screening for abdominal aortic aneurysm (AAA) and surgical repair of large AAAs by sound wave imaging (ultrasonography) is recommended for ages 65 to 75 years who are current or former smokers.  Stay current with your immunizations. Document Released: 11/25/2007 Document Revised: 08/21/2011 Document Reviewed: 10/24/2010 [employer] Patient Information 2013 [employer].  

## 2012-07-01 NOTE — Assessment & Plan Note (Signed)
Will check LFT's today If they are normal he will start lamisil He will RTC in one month for recheck on the LFT's

## 2012-07-01 NOTE — Assessment & Plan Note (Signed)
Exam done Vaccines were reviewed Labs ordered Pt ed material was given 

## 2012-07-01 NOTE — Progress Notes (Signed)
Subjective:    Patient ID: Thomas Berry, male    DOB: 04-24-1971, 42 y.o.   MRN: [id number]  Hypertension This is a chronic problem. The current episode started more than 1 year ago. The problem is unchanged. The problem is controlled. Pertinent negatives include no anxiety, blurred vision, chest pain, headaches, malaise/fatigue, neck pain, orthopnea, palpitations, peripheral edema, PND, shortness of breath or sweats. Past treatments include angiotensin blockers and diuretics. The current treatment provides moderate improvement. Compliance problems include diet, psychosocial issues and exercise.       Review of Systems  Constitutional: Negative.  Negative for malaise/fatigue.  HENT: Negative.  Negative for neck pain.   Eyes: Negative.  Negative for blurred vision.  Respiratory: Negative for cough, choking, chest tightness, shortness of breath, wheezing and stridor.   Cardiovascular: Negative for chest pain, palpitations, orthopnea, leg swelling and PND.  Gastrointestinal: Negative for nausea, vomiting, abdominal pain, constipation and blood in stool.  Genitourinary: Negative for dysuria, urgency, frequency, hematuria, flank pain, decreased urine volume, discharge, penile swelling, scrotal swelling, enuresis, difficulty urinating, genital sores, penile pain and testicular pain.  Musculoskeletal: Negative for myalgias, back pain, joint swelling, arthralgias and gait problem.  Skin: Positive for color change (toenails are dark and thick) and rash (itchy areas on both hands). Negative for pallor and wound.  Neurological: Negative.  Negative for dizziness, light-headedness and headaches.  Hematological: Negative for adenopathy. Does not bruise/bleed easily.  Psychiatric/Behavioral: Negative.        Objective:   Physical Exam  Vitals reviewed. Constitutional: He is oriented to person, place, and time. He appears well-developed and well-nourished. No distress.  HENT:  Head: Normocephalic  and atraumatic.  Mouth/Throat: Oropharynx is clear and moist. No oropharyngeal exudate.  Eyes: Conjunctivae normal are normal. Right eye exhibits no discharge. Left eye exhibits no discharge. No scleral icterus.  Neck: Normal range of motion. Neck supple. No JVD present. No tracheal deviation present. No thyromegaly present.  Cardiovascular: Normal rate, regular rhythm, normal heart sounds and intact distal pulses.  Exam reveals no gallop and no friction rub.   No murmur heard. Pulmonary/Chest: Effort normal and breath sounds normal. No stridor. No respiratory distress. He has no wheezes. He has no rales. He exhibits no tenderness.  Abdominal: Soft. Bowel sounds are normal. He exhibits no distension and no mass. There is no tenderness. There is no rebound and no guarding. Hernia confirmed negative in the right inguinal area and confirmed negative in the left inguinal area.  Genitourinary: Rectum normal, prostate normal, testes normal and penis normal. Rectal exam shows no external hemorrhoid, no internal hemorrhoid, no fissure, no mass, no tenderness and anal tone normal. Guaiac negative stool. Prostate is not enlarged and not tender. Right testis shows no mass, no swelling and no tenderness. Right testis is descended. Left testis shows no mass, no swelling and no tenderness. Left testis is descended. Uncircumcised. No phimosis, paraphimosis, hypospadias, penile erythema or penile tenderness. No discharge found.  Musculoskeletal: Normal range of motion. He exhibits no edema and no tenderness.  Lymphadenopathy:    He has no cervical adenopathy.       Right: No inguinal adenopathy present.       Left: No inguinal adenopathy present.  Neurological: He is oriented to person, place, and time.  Skin: Skin is warm, dry and intact. Rash noted. No bruising, no ecchymosis and no purpura noted. Rash is macular (on both hands over the dorsum with scale,xerosis,lichenification). Rash is not papular, not  maculopapular, not nodular,  not pustular, not vesicular and not urticarial. He is not diaphoretic. No cyanosis or erythema. No pallor. Nails show no clubbing.       All toenails are dark and thick with lysis and subungual debris  Psychiatric: He has a normal mood and affect. His behavior is normal. Judgment and thought content normal.          Assessment & Plan:

## 2012-07-01 NOTE — Assessment & Plan Note (Signed)
Will treat with TAC cream 

## 2012-08-06 ENCOUNTER — Ambulatory Visit: Payer: 59 | Admitting: Internal Medicine

## 2012-08-20 ENCOUNTER — Ambulatory Visit: Payer: 59 | Admitting: Internal Medicine

## 2012-08-20 DIAGNOSIS — Z0289 Encounter for other administrative examinations: Secondary | ICD-10-CM

## 2012-09-03 ENCOUNTER — Encounter: Payer: Self-pay | Admitting: Internal Medicine

## 2012-09-03 ENCOUNTER — Ambulatory Visit (INDEPENDENT_AMBULATORY_CARE_PROVIDER_SITE_OTHER): Payer: 59 | Admitting: Internal Medicine

## 2012-09-03 ENCOUNTER — Other Ambulatory Visit (INDEPENDENT_AMBULATORY_CARE_PROVIDER_SITE_OTHER): Payer: 59

## 2012-09-03 VITALS — BP 130/88 | HR 90 | Temp 98.4°F | Resp 16 | Wt 182.5 lb

## 2012-09-03 DIAGNOSIS — J309 Allergic rhinitis, unspecified: Secondary | ICD-10-CM

## 2012-09-03 DIAGNOSIS — B351 Tinea unguium: Secondary | ICD-10-CM

## 2012-09-03 DIAGNOSIS — I10 Essential (primary) hypertension: Secondary | ICD-10-CM

## 2012-09-03 DIAGNOSIS — Z0000 Encounter for general adult medical examination without abnormal findings: Secondary | ICD-10-CM

## 2012-09-03 LAB — COMPREHENSIVE METABOLIC PANEL
ALT: 63 U/L — ABNORMAL HIGH (ref 0–53)
CO2: 30 mEq/L (ref 19–32)
Calcium: 9.4 mg/dL (ref 8.4–10.5)
Chloride: 100 mEq/L (ref 96–112)
GFR: 136.97 mL/min (ref >60.00)
Sodium: 138 mEq/L (ref 135–145)
Total Protein: 7.9 g/dL (ref 6.0–8.3)

## 2012-09-03 MED ORDER — BECLOMETHASONE DIPROPIONATE 80 MCG/ACT NA AERS: 4.0000 | INHALATION_SPRAY | Freq: Every day | NASAL | Status: DC

## 2012-09-03 MED ORDER — FEXOFENADINE HCL 180 MG PO TABS: 180.0000 mg | ORAL_TABLET | Freq: Every day | ORAL | Status: DC

## 2012-09-03 NOTE — Assessment & Plan Note (Signed)
His BP is well controlled I will check his lytes today

## 2012-09-03 NOTE — Assessment & Plan Note (Signed)
Start Qnasl and restart Careers adviser

## 2012-09-03 NOTE — Assessment & Plan Note (Signed)
He appears to be doing well on terbinafine I will check his LFT's today and will advise further

## 2012-09-03 NOTE — Progress Notes (Signed)
Subjective:    Patient ID: Thomas Berry, male    DOB: 1970/09/22, 42 y.o.   MRN: [id number]  Allergic Reaction This is a recurrent problem. The current episode started more than 1 week ago. The problem occurs intermittently. The problem has been gradually worsening since onset. The problem is mild. It is unknown what he was exposed to. The time of exposure was just prior to onset. Associated symptoms include eye watering. Pertinent negatives include no abdominal pain, chest pain, chest pressure, coughing, diarrhea, difficulty breathing, drooling, eye itching, eye redness, globus sensation, hyperventilation, itching, rash, stridor, trouble swallowing, vomiting or wheezing. There is no swelling present. Past treatments include nothing. The treatment provided no relief. His past medical history is significant for atopic dermatitis and seasonal allergies. There is no history of asthma, food allergies or medication allergies.      Review of Systems  Constitutional: Negative for fever, chills, diaphoresis, activity change, appetite change, fatigue and unexpected weight change.  HENT: Positive for congestion, rhinorrhea and postnasal drip. Negative for ear pain, nosebleeds, sore throat, facial swelling, sneezing, drooling, trouble swallowing, neck pain, neck stiffness, voice change, sinus pressure and tinnitus.   Eyes: Negative.  Negative for photophobia, pain, discharge, redness, itching and visual disturbance.  Respiratory: Negative.  Negative for apnea, cough, choking, chest tightness, shortness of breath, wheezing and stridor.   Cardiovascular: Negative.  Negative for chest pain, palpitations and leg swelling.  Gastrointestinal: Negative.  Negative for nausea, vomiting, abdominal pain, diarrhea, constipation and blood in stool.  Endocrine: Negative.   Genitourinary: Negative.   Musculoskeletal: Negative.  Negative for myalgias, back pain, joint swelling, arthralgias and gait problem.  Skin:  Negative for color change, itching, pallor, rash and wound.  Allergic/Immunologic: Positive for environmental allergies. Negative for food allergies and immunocompromised state.  Neurological: Negative.  Negative for seizures, weakness and light-headedness.  Hematological: Negative.  Negative for adenopathy. Does not bruise/bleed easily.  Psychiatric/Behavioral: Negative.        Objective:   Physical Exam  Vitals reviewed. Constitutional: He is oriented to person, place, and time. He appears well-developed and well-nourished.  Non-toxic appearance. He does not have a sickly appearance. He does not appear ill. No distress.  HENT:  Head: Normocephalic and atraumatic. No trismus in the jaw.  Nose: Mucosal edema and rhinorrhea present. No nose lacerations, sinus tenderness, nasal deformity, septal deviation or nasal septal hematoma. No epistaxis.  No foreign bodies. Right sinus exhibits no maxillary sinus tenderness and no frontal sinus tenderness. Left sinus exhibits no maxillary sinus tenderness and no frontal sinus tenderness.  Mouth/Throat: Oropharynx is clear and moist and mucous membranes are normal. Mucous membranes are not pale, not dry and not cyanotic. No oral lesions. No edematous. No oropharyngeal exudate, posterior oropharyngeal edema, posterior oropharyngeal erythema or tonsillar abscesses.  Eyes: Conjunctivae and EOM are normal. Pupils are equal, round, and reactive to light. Right eye exhibits no chemosis and no discharge. Left eye exhibits no chemosis and no discharge. Right conjunctiva is not injected. Right conjunctiva has no hemorrhage. Left conjunctiva is not injected. Left conjunctiva has no hemorrhage. No scleral icterus.  Neck: Normal range of motion. Neck supple. No JVD present. No tracheal deviation present. No thyromegaly present.  Cardiovascular: Normal rate, regular rhythm, normal heart sounds and intact distal pulses.  Exam reveals no gallop and no friction rub.   No  murmur heard. Pulmonary/Chest: Effort normal and breath sounds normal. No stridor. No respiratory distress. He has no wheezes. He has no rales. He  exhibits no tenderness.  Abdominal: Soft. Bowel sounds are normal. He exhibits no distension and no mass. There is no tenderness. There is no rebound and no guarding.  Musculoskeletal: Normal range of motion. He exhibits no edema and no tenderness.  Lymphadenopathy:    He has no cervical adenopathy.  Neurological: He is oriented to person, place, and time.  Skin: Skin is warm and dry. No rash noted. He is not diaphoretic. No erythema. No pallor.  Psychiatric: He has a normal mood and affect. His behavior is normal. Judgment and thought content normal.     Lab Results  Component Value Date   WBC 8.9 07/01/2012   HGB 15.0 07/01/2012   HCT 43.8 07/01/2012   PLT 291.0 07/01/2012   GLUCOSE 96 07/01/2012   CHOL 193 07/01/2012   TRIG 81.0 07/01/2012   HDL 74.70 07/01/2012   LDLCALC 102* 07/01/2012   ALT 38 07/01/2012   AST 41* 07/01/2012   NA 137 07/01/2012   K 4.0 07/01/2012   CL 96 07/01/2012   CREATININE 0.8 07/01/2012   BUN 14 07/01/2012   CO2 29 07/01/2012   TSH 0.80 07/01/2012   PSA 0.50 07/01/2012   INR 1.03 09/16/2009   HGBA1C 4.7 05/24/2011       Assessment & Plan:

## 2012-09-03 NOTE — Patient Instructions (Signed)

## 2012-10-11 ENCOUNTER — Telehealth: Payer: Self-pay | Admitting: Internal Medicine

## 2012-10-11 DIAGNOSIS — I10 Essential (primary) hypertension: Secondary | ICD-10-CM

## 2012-10-11 MED ORDER — OLMESARTAN MEDOXOMIL-HCTZ 40-12.5 MG PO TABS: 1.0000 | ORAL_TABLET | Freq: Every day | ORAL | Status: DC

## 2012-10-11 NOTE — Telephone Encounter (Signed)
The pt is hoping to get a refill of his blood pressure medication.  He has made a follow up apt for Tuesday, the 6th.

## 2012-10-15 ENCOUNTER — Other Ambulatory Visit (INDEPENDENT_AMBULATORY_CARE_PROVIDER_SITE_OTHER): Payer: 59

## 2012-10-15 ENCOUNTER — Encounter: Payer: Self-pay | Admitting: Internal Medicine

## 2012-10-15 ENCOUNTER — Ambulatory Visit (INDEPENDENT_AMBULATORY_CARE_PROVIDER_SITE_OTHER): Payer: 59 | Admitting: Internal Medicine

## 2012-10-15 VITALS — BP 138/80 | HR 101 | Temp 98.7°F | Resp 16 | Wt 183.0 lb

## 2012-10-15 DIAGNOSIS — I10 Essential (primary) hypertension: Secondary | ICD-10-CM

## 2012-10-15 DIAGNOSIS — R7309 Other abnormal glucose: Secondary | ICD-10-CM

## 2012-10-15 DIAGNOSIS — R7402 Elevation of levels of lactic acid dehydrogenase (LDH): Secondary | ICD-10-CM

## 2012-10-15 DIAGNOSIS — R739 Hyperglycemia, unspecified: Secondary | ICD-10-CM

## 2012-10-15 DIAGNOSIS — K760 Fatty (change of) liver, not elsewhere classified: Secondary | ICD-10-CM | POA: Insufficient documentation

## 2012-10-15 LAB — COMPREHENSIVE METABOLIC PANEL
Alkaline Phosphatase: 49 U/L (ref 39–117)
BUN: 11 mg/dL (ref 6–23)
Glucose, Bld: 117 mg/dL — ABNORMAL HIGH (ref 70–99)
Total Bilirubin: 0.9 mg/dL (ref 0.3–1.2)

## 2012-10-15 LAB — HEPATITIS C ANTIBODY: HCV Ab: NEGATIVE

## 2012-10-15 LAB — HEMOGLOBIN A1C: Hgb A1c MFr Bld: 4.6 % (ref 4.6–6.5)

## 2012-10-15 LAB — PROTIME-INR
INR: 1.1 ratio — ABNORMAL HIGH (ref 0.8–1.0)
Prothrombin Time: 11.3 s (ref 10.2–12.4)

## 2012-10-15 NOTE — Assessment & Plan Note (Signed)
His BP is well controlled Today I will check his lytes and renal function 

## 2012-10-15 NOTE — Assessment & Plan Note (Signed)
I will check his A1C to see if he has developed DM2 

## 2012-10-15 NOTE — Assessment & Plan Note (Signed)
I will recheck his LFT's Have ordered an u/s to see if he has a fatty liver or liver lesions Have also ordered a viral hepatitis panel He will curtail his drinking

## 2012-10-15 NOTE — Progress Notes (Signed)
  Subjective:    Patient ID: Thomas Berry, male    DOB: [birth year], 42 y.o.   MRN: [id number]  Hypertension This is a chronic problem. The current episode started more than 1 year ago. The problem is controlled. Pertinent negatives include no anxiety, blurred vision, chest pain, headaches, malaise/fatigue, neck pain, orthopnea, palpitations, peripheral edema, PND, shortness of breath or sweats. Past treatments include angiotensin blockers and diuretics. The current treatment provides moderate improvement. There are no compliance problems.       Review of Systems  Constitutional: Negative.  Negative for fever, chills, malaise/fatigue, diaphoresis, activity change, appetite change, fatigue and unexpected weight change.  HENT: Negative.  Negative for neck pain.   Eyes: Negative.  Negative for blurred vision and pain.  Respiratory: Negative.  Negative for cough, choking, shortness of breath, wheezing and stridor.   Cardiovascular: Negative.  Negative for chest pain, palpitations, orthopnea, leg swelling and PND.  Gastrointestinal: Negative.  Negative for nausea, vomiting, abdominal pain, diarrhea, constipation, blood in stool and abdominal distention.  Endocrine: Negative.   Genitourinary: Negative.   Musculoskeletal: Negative.   Skin: Negative.   Allergic/Immunologic: Negative.   Neurological: Negative.  Negative for dizziness and headaches.  Hematological: Negative.  Negative for adenopathy. Does not bruise/bleed easily.  Psychiatric/Behavioral: Negative.        Objective:   Physical Exam  Vitals reviewed. Constitutional: He is oriented to person, place, and time. He appears well-developed and well-nourished. No distress.  HENT:  Head: Normocephalic and atraumatic.  Mouth/Throat: Oropharynx is clear and moist. No oropharyngeal exudate.  Eyes: Conjunctivae are normal. Right eye exhibits no discharge. Left eye exhibits no discharge. No scleral icterus.  Neck: Normal range of motion.  Neck supple. No JVD present. No tracheal deviation present. No thyromegaly present.  Cardiovascular: Normal rate, regular rhythm, normal heart sounds and intact distal pulses.  Exam reveals no gallop and no friction rub.   No murmur heard. Pulmonary/Chest: Effort normal and breath sounds normal. No stridor. No respiratory distress. He has no wheezes. He has no rales. He exhibits no tenderness.  Abdominal: Soft. Bowel sounds are normal. He exhibits no distension and no mass. There is no tenderness. There is no rebound and no guarding.  Musculoskeletal: Normal range of motion. He exhibits no edema and no tenderness.  Lymphadenopathy:    He has no cervical adenopathy.  Neurological: He is oriented to person, place, and time.  Skin: Skin is warm and dry. No rash noted. He is not diaphoretic. No erythema. No pallor.  Psychiatric: He has a normal mood and affect. His behavior is normal. Judgment and thought content normal.     Lab Results  Component Value Date   WBC 8.9 07/01/2012   HGB 15.0 07/01/2012   HCT 43.8 07/01/2012   PLT 291.0 07/01/2012   GLUCOSE 104* 09/03/2012   CHOL 193 07/01/2012   TRIG 81.0 07/01/2012   HDL 74.70 07/01/2012   LDLCALC 102* 07/01/2012   ALT 63* 09/03/2012   AST 72* 09/03/2012   NA 138 09/03/2012   K 3.8 09/03/2012   CL 100 09/03/2012   CREATININE 0.8 09/03/2012   BUN 12 09/03/2012   CO2 30 09/03/2012   TSH 0.80 07/01/2012   PSA 0.50 07/01/2012   INR 1.03 09/16/2009   HGBA1C 4.7 05/24/2011       Assessment & Plan:

## 2012-10-15 NOTE — Patient Instructions (Signed)
Hepatitis Virus Studies Hepatitis is an inflammation of the liver which can be caused by viruses, alcohol intake, various drugs, toxins, or bad infections caused by bacteria. The three most common viruses now recognized to cause disease are hepatitis A, hepatitis B, and hepatitis C (also called non A / non B). Hepatitis D and E are other forms of viruses. Most often the hepatitis viruses are associated with the enzymes found in the liver. These enzymes include the AST, ALT, and LDH. These are tests used to measure the likelihood of you having one of these viral infections.  The tests that are often done for liver function are: Serologic findings: HAV-AB/IgM  Hepatitis Testing Appearance/disappearance: 4-6 weeks/3-4 months  Application: Acute HAV infection Serologic findings: HAV-Ab/IgG  Hepatitis Testing Appearance/disappearance: 8-12 weeks/10years  Application: Previous HAV exposure Serologic findings: HBeAg  Hepatitis Testing Appearance/disappearance: 1-3 weeks/6-8 weeks  Application: Acute HBV infection Serologic findings: HBeAb  Hepatitis Testing Appearance/disappearance: 4-6 weeks/4-6 years  Application: Acute HBV infection ended Serologic findings: HBsAg  Hepatitis Testing Appearance/disappearance: 4-12 weeks/1-3 months  Application: Acute HBV infection Serologic findings: HBsAb total  Hepatitis Testing Appearance/disappearance: 3-10 months/6-10 years  Application: Previous HBV infection Serologic findings: HBVc-Ab/IgM  Hepatitis Testing Appearance/disappearance: 2-12 weeks/3-6 months  Application: Acute HBV infection Serologic findings: HBVc-Ab total  Hepatitis Testing Appearance/disappearance: 3-12 weeks/life  Application: Previous HBV infection Serologic findings: HCV-Ab/IgG  Hepatitis Testing Appearance/disappearance: 3-4 months/2years  Application: Previous HCV infection Serologic findings: HDV Ag  Hepatitis Testing Appearance/disappearance: 1-3 days/3-5  days  Application: Acute HDV infection Serologic findings: HDV-Ab total  Hepatitis Testing Appearance/disappearance: 2-3 months/7-14 months  Application: Chronic HDV infection PREPARATION FOR TEST No preparation or fasting is necessary. A blood sample is obtained by inserting a needle into a vein in the arm. NORMAL FINDINGS Negative Ranges for normal findings may vary among different laboratories and hospitals. You should always check with your doctor after having lab work or other tests done to discuss the meaning of your test results and whether your values are considered within normal limits. MEANING OF TEST  Your caregiver will go over the test results with you and discuss the importance and meaning of your results, as well as treatment options and the need for additional tests if necessary. OBTAINING THE TEST RESULTS It is your responsibility to obtain your test results. Ask the lab or department performing the test when and how you will get your results. Document Released: 07/01/2004 Document Revised: 08/21/2011 Document Reviewed: 05/09/2008 [facility] Patient Information 2013 [place], [place].

## 2012-10-16 ENCOUNTER — Encounter: Payer: Self-pay | Admitting: Internal Medicine

## 2012-10-16 LAB — HEPATITIS B SURFACE ANTIBODY,QUALITATIVE: Hep B S Ab: NONREACTIVE

## 2012-11-22 ENCOUNTER — Encounter (HOSPITAL_COMMUNITY): Payer: Self-pay

## 2012-11-22 ENCOUNTER — Emergency Department (HOSPITAL_COMMUNITY)
Admission: EM | Admit: 2012-11-22 | Discharge: 2012-11-22 | Disposition: A | Discharge status: Home / Self Care | Payer: PRIVATE HEALTH INSURANCE | Attending: Emergency Medicine | Admitting: Emergency Medicine

## 2012-11-22 DIAGNOSIS — J45909 Unspecified asthma, uncomplicated: Secondary | ICD-10-CM | POA: Insufficient documentation

## 2012-11-22 DIAGNOSIS — S61209A Unspecified open wound of unspecified finger without damage to nail, initial encounter: Secondary | ICD-10-CM | POA: Insufficient documentation

## 2012-11-22 DIAGNOSIS — Z87448 Personal history of other diseases of urinary system: Secondary | ICD-10-CM | POA: Insufficient documentation

## 2012-11-22 DIAGNOSIS — W260XXA Contact with knife, initial encounter: Secondary | ICD-10-CM | POA: Insufficient documentation

## 2012-11-22 DIAGNOSIS — Y939 Activity, unspecified: Secondary | ICD-10-CM | POA: Insufficient documentation

## 2012-11-22 DIAGNOSIS — S61219A Laceration without foreign body of unspecified finger without damage to nail, initial encounter: Secondary | ICD-10-CM

## 2012-11-22 DIAGNOSIS — I10 Essential (primary) hypertension: Secondary | ICD-10-CM | POA: Insufficient documentation

## 2012-11-22 DIAGNOSIS — Y929 Unspecified place or not applicable: Secondary | ICD-10-CM | POA: Insufficient documentation

## 2012-11-22 DIAGNOSIS — Z79899 Other long term (current) drug therapy: Secondary | ICD-10-CM | POA: Insufficient documentation

## 2012-11-22 NOTE — ED Notes (Signed)
Pt is cook at [employer].  Sliced the tip of right middle finger off.  Bleeding not completely controlled, wrapped in bandage.   Pt reports last tdap last year.  Pt alert oriented X4

## 2012-11-22 NOTE — ED Provider Notes (Signed)
History     CSN: [id number]  Arrival date & time 11/22/12  1438   First MD Initiated Contact with Patient 11/22/12 1451      No chief complaint on file.   (Consider location/radiation/quality/duration/timing/severity/associated sxs/prior treatment) HPI Comments: Pt presents to the ED for finger laceration.  Reports he was fanning over the stove and cut his right middle finger on a knife that was lying on the stove.  There was profuse bleeding at time of injury that could not be controlled with direct pressure.  Wound is bandaged on arrival with blood seeping through both layers.  Pt tetanus is UTD.   Denies any numbness or paresthesias of finger.  Full ROM maintained.  The history is provided by the patient.    Past Medical History  Diagnosis Date  . Asthma   . HTN (hypertension)   . ED (erectile dysfunction)   . Glaucoma   . Abnormal EKG     09/20/09 LVH in limb leads  . Other and unspecified alcohol dependence, episodic drinking behavior   . Hypertrophy of prostate with urinary obstruction and other lower urinary tract symptoms (LUTS)   . Routine general medical examination at a health care facility     History reviewed. No pertinent past surgical history.  Family History  Problem Relation Age of Onset  . Hypertension Other   . Stroke Other   . Cancer Neg Hx   . Alcohol abuse Neg Hx   . Diabetes Neg Hx   . Early death Neg Hx   . Heart disease Neg Hx   . Hyperlipidemia Neg Hx   . Kidney disease Neg Hx     History  Substance Use Topics  . Smoking status: Never Smoker   . Smokeless tobacco: Never Used  . Alcohol Use: 16.8 oz/week    0 Drinks containing 0.5 oz of alcohol, 28 Cans of beer per week      Review of Systems  Skin: Positive for wound.  All other systems reviewed and are negative.    Allergies  Ace inhibitors  Home Medications   Current Outpatient Rx  Name  Route  Sig  Dispense  Refill  . Beclomethasone Dipropionate (QNASL) 80 MCG/ACT AERS   Nasal   Place 4 puffs into the nose daily.   8.7 g   11   . fexofenadine (ALLEGRA) 180 MG tablet   Oral   Take 180 mg by mouth daily as needed (for allergies).         . naphazoline-pheniramine (NAPHCON-A) 0.025-0.3 % ophthalmic solution   Both Eyes   Place 1 drop into both eyes 4 (four) times daily as needed (for dry/red/itchy eyes).         Marland Kitchen olmesartan-hydrochlorothiazide (BENICAR HCT) 40-12.5 MG per tablet   Oral   Take 1 tablet by mouth daily.   90 tablet   3   . sildenafil (VIAGRA) 50 MG tablet   Oral   Take 50 mg by mouth as directed.             BP 148/100  Pulse 107  Temp(Src) 99.2 F (37.3 C) (Oral)  Resp 18  SpO2 97%  Physical Exam  Nursing note and vitals reviewed. Constitutional: He is oriented to person, place, and time. He appears well-developed and well-nourished.  HENT:  Head: Normocephalic and atraumatic.  Eyes: Conjunctivae and EOM are normal.  Neck: Normal range of motion. Neck supple.  Cardiovascular: Normal rate, regular rhythm and normal heart sounds.  Pulmonary/Chest: Effort normal and breath sounds normal.  Abdominal: Soft.  Musculoskeletal: Normal range of motion.       Hands: Right 3rd digit with distal avulsion laceration, no bony involvement, bleeding profusely, full ROM, sensation intact  Neurological: He is alert and oriented to person, place, and time.  Skin: Skin is warm and dry.  Psychiatric: He has a normal mood and affect.    ED Course  Procedures (including critical care time)  Labs Reviewed - No data to display No results found.   1. Finger laceration, initial encounter       MDM   Wound evaluated, no FB, bony involvement, or signs of infection.  Tetanus UTD.  Bleeding controlled with quick clot.  Pressure dressing applied.  Instructed to leave dressing in place until tomorrow.  If bleeding should return once bandage removed, apply direct pressure.  If cannot control with direct pressure, return to the ED.   Discussed plan with pt, he agreed.  Return precautions advised.       Garlon Hatchet, PA-C 11/22/12 1642

## 2012-11-22 NOTE — ED Notes (Signed)
Pt presents with laceration to R 3rd finger at 1330.  Pt reports fanning over stove, striking a knife that was beside stove.

## 2012-11-23 ENCOUNTER — Encounter (HOSPITAL_COMMUNITY): Payer: Self-pay | Admitting: Emergency Medicine

## 2012-11-23 ENCOUNTER — Emergency Department (HOSPITAL_COMMUNITY)
Admission: EM | Admit: 2012-11-23 | Discharge: 2012-11-23 | Disposition: A | Discharge status: Home / Self Care | Payer: 59 | Attending: Emergency Medicine | Admitting: Emergency Medicine

## 2012-11-23 DIAGNOSIS — S61219D Laceration without foreign body of unspecified finger without damage to nail, subsequent encounter: Secondary | ICD-10-CM

## 2012-11-23 DIAGNOSIS — Z87448 Personal history of other diseases of urinary system: Secondary | ICD-10-CM | POA: Insufficient documentation

## 2012-11-23 DIAGNOSIS — J45909 Unspecified asthma, uncomplicated: Secondary | ICD-10-CM | POA: Insufficient documentation

## 2012-11-23 DIAGNOSIS — IMO0002 Reserved for concepts with insufficient information to code with codable children: Secondary | ICD-10-CM | POA: Insufficient documentation

## 2012-11-23 DIAGNOSIS — Z4800 Encounter for change or removal of nonsurgical wound dressing: Secondary | ICD-10-CM | POA: Insufficient documentation

## 2012-11-23 DIAGNOSIS — F1020 Alcohol dependence, uncomplicated: Secondary | ICD-10-CM | POA: Insufficient documentation

## 2012-11-23 DIAGNOSIS — Z8669 Personal history of other diseases of the nervous system and sense organs: Secondary | ICD-10-CM | POA: Insufficient documentation

## 2012-11-23 DIAGNOSIS — I10 Essential (primary) hypertension: Secondary | ICD-10-CM | POA: Insufficient documentation

## 2012-11-23 NOTE — ED Provider Notes (Signed)
History     CSN: [id number]  Arrival date & time 11/23/12  1538   First MD Initiated Contact with Patient 11/23/12 1712      Chief Complaint  Patient presents with  . Wound Check    (Consider location/radiation/quality/duration/timing/severity/associated sxs/prior treatment) Patient is a 42 y.o. male presenting with wound check. The history is provided by the patient.  Wound Check This is a new problem. The current episode started yesterday. The problem occurs constantly. The problem has been unchanged. Pertinent negatives include no abdominal pain, chest pain, chills, fever, nausea or vomiting. Nothing aggravates the symptoms. He has tried nothing for the symptoms.    Past Medical History  Diagnosis Date  . Asthma   . HTN (hypertension)   . ED (erectile dysfunction)   . Glaucoma   . Abnormal EKG     09/20/09 LVH in limb leads  . Other and unspecified alcohol dependence, episodic drinking behavior   . Hypertrophy of prostate with urinary obstruction and other lower urinary tract symptoms (LUTS)   . Routine general medical examination at a health care facility     History reviewed. No pertinent past surgical history.  Family History  Problem Relation Age of Onset  . Hypertension Other   . Stroke Other   . Cancer Neg Hx   . Alcohol abuse Neg Hx   . Diabetes Neg Hx   . Early death Neg Hx   . Heart disease Neg Hx   . Hyperlipidemia Neg Hx   . Kidney disease Neg Hx     History  Substance Use Topics  . Smoking status: Never Smoker   . Smokeless tobacco: Never Used  . Alcohol Use: 16.8 oz/week    0 Drinks containing 0.5 oz of alcohol, 28 Cans of beer per week      Review of Systems  Constitutional: Negative for fever and chills.  Eyes: Negative for visual disturbance.  Respiratory: Negative for chest tightness and shortness of breath.   Cardiovascular: Negative for chest pain.  Gastrointestinal: Negative for nausea, vomiting, abdominal pain and diarrhea.  Skin:  Positive for wound.  All other systems reviewed and are negative.    Allergies  Ace inhibitors  Home Medications   Current Outpatient Rx  Name  Route  Sig  Dispense  Refill  . Beclomethasone Dipropionate (QNASL) 80 MCG/ACT AERS   Nasal   Place 4 puffs into the nose daily.   8.7 g   11   . fexofenadine (ALLEGRA) 180 MG tablet   Oral   Take 180 mg by mouth daily as needed (for allergies).         . naphazoline-pheniramine (NAPHCON-A) 0.025-0.3 % ophthalmic solution   Both Eyes   Place 1 drop into both eyes 4 (four) times daily as needed (for dry/red/itchy eyes).         Marland Kitchen olmesartan-hydrochlorothiazide (BENICAR HCT) 40-12.5 MG per tablet   Oral   Take 1 tablet by mouth daily.   90 tablet   3   . sildenafil (VIAGRA) 50 MG tablet   Oral   Take 50 mg by mouth as directed.             BP 169/104  Pulse 100  Temp(Src) 98.2 F (36.8 C) (Oral)  Resp 20  SpO2 100%  Physical Exam  Nursing note and vitals reviewed. Constitutional: He is oriented to person, place, and time. He appears well-developed and well-nourished. No distress.  HENT:  Head: Normocephalic and atraumatic.  Right Ear:  External ear normal.  Left Ear: External ear normal.  Mouth/Throat: Oropharynx is clear and moist.  Eyes: Pupils are equal, round, and reactive to light.  Neck: Normal range of motion. Neck supple.  Cardiovascular: Normal rate, regular rhythm, normal heart sounds and intact distal pulses.  Exam reveals no gallop and no friction rub.   No murmur heard. Pulmonary/Chest: Effort normal and breath sounds normal. No respiratory distress. He has no wheezes. He has no rales.  Abdominal: Soft. There is no tenderness. There is no rebound and no guarding.  Musculoskeletal: Normal range of motion. He exhibits no edema and no tenderness.  Lymphadenopathy:    He has no cervical adenopathy.  Neurological: He is alert and oriented to person, place, and time.  Skin: Skin is warm and dry. No rash  noted. No erythema.  Small avulsion of skin at tip of right middle finger with slight oozing of blood. No surrounding erythema, induration or drainage. FDS/FDP fxn in tact.   Psychiatric: He has a normal mood and affect. His behavior is normal.    ED Course  Procedures (including critical care time)  Labs Reviewed - No data to display No results found.   1. Laceration of finger of right hand, subsequent encounter       MDM  5:22 PM 42yo male here with bleeding from finger tip. Was seen yesterday after accidentally cutting in with knife. Yesterday had quick clot applied. Removed dressing today and has had continuous bleeding. Applied wound seal here. No evidence of cellulitis. NV in tact. Wound seal placed and pressure applied with hemostasis achieved. Dressing reapplied. He will followup with his primary physician for reevaluation. He voiced understanding of plan, given return precautions and discharged home in stable condition.        Caren Hazy, MD 11/23/12 1911

## 2012-11-23 NOTE — ED Provider Notes (Signed)
I saw and evaluated the patient, reviewed the resident's note and I agree with the findings and plan.  Superficial. Distal tip volar pad right long fingertip soft tissue injury only with no bone joint tenderness neurovascular involvement noted dressing fell off from yesterday bleeding recurred no evidence of infection or purulent drainage no spreading cellulitis QR powder applied to attempt wound hemostasis.  Hurman Horn, MD 11/24/12 [phone]

## 2012-11-23 NOTE — ED Notes (Signed)
Pt seen here yesterday for laceration to right middle finger. Pt attempted to remove dressing that was in place and bleeding began again. Pt reprots unable to control bleeding. Pt presents with bandaids and adhesive tape on right middle finger at present.

## 2012-11-23 NOTE — ED Notes (Signed)
md in to see pt on arrival to room

## 2012-11-23 NOTE — ED Provider Notes (Signed)
Medical screening examination/treatment/procedure(s) were performed by non-physician practitioner and as supervising physician I was immediately available for consultation/collaboration.   Joya Gaskins, MD 11/23/12 [phone]

## 2013-08-18 ENCOUNTER — Other Ambulatory Visit: Payer: Self-pay | Admitting: Internal Medicine

## 2014-01-23 ENCOUNTER — Telehealth: Payer: Self-pay | Admitting: Internal Medicine

## 2014-01-23 DIAGNOSIS — I10 Essential (primary) hypertension: Secondary | ICD-10-CM

## 2014-01-23 MED ORDER — OLMESARTAN MEDOXOMIL-HCTZ 40-12.5 MG PO TABS: 1.0000 | ORAL_TABLET | Freq: Every day | ORAL | Status: DC

## 2014-01-23 NOTE — Telephone Encounter (Signed)
done

## 2014-01-23 NOTE — Telephone Encounter (Signed)
Pt request refill for Benicar, pt schedule to come in on 01/28/14 and he was wondering if we can send some in to [facility]. Please advise

## 2014-01-28 ENCOUNTER — Telehealth: Payer: Self-pay | Admitting: Internal Medicine

## 2014-01-28 ENCOUNTER — Ambulatory Visit: Payer: 59 | Admitting: Internal Medicine

## 2014-01-28 DIAGNOSIS — I10 Essential (primary) hypertension: Secondary | ICD-10-CM

## 2014-01-28 MED ORDER — OLMESARTAN MEDOXOMIL-HCTZ 40-12.5 MG PO TABS: 1.0000 | ORAL_TABLET | Freq: Every day | ORAL | Status: DC

## 2014-01-28 NOTE — Telephone Encounter (Signed)
Patient had appt for today but had to move b/c provider is out of office.  We reschedule appt for 9/2.  Patient has only one pill left of benicar HCT.  He is requesting a script to get him through until that appt.  Please advise.

## 2014-01-28 NOTE — Telephone Encounter (Signed)
RX approved and sent to pharmacy via e-script.

## 2014-02-11 ENCOUNTER — Ambulatory Visit: Payer: 59 | Admitting: Internal Medicine

## 2014-02-19 ENCOUNTER — Ambulatory Visit: Payer: 59 | Admitting: Internal Medicine

## 2014-02-25 ENCOUNTER — Ambulatory Visit: Payer: 59 | Admitting: Internal Medicine

## 2014-02-25 DIAGNOSIS — Z0289 Encounter for other administrative examinations: Secondary | ICD-10-CM

## 2014-05-26 ENCOUNTER — Ambulatory Visit (INDEPENDENT_AMBULATORY_CARE_PROVIDER_SITE_OTHER): Payer: 59 | Admitting: Family

## 2014-05-26 ENCOUNTER — Encounter: Payer: Self-pay | Admitting: Family

## 2014-05-26 VITALS — BP 160/80 | HR 96 | Temp 98.2°F | Resp 18 | Ht 72.0 in | Wt 191.0 lb

## 2014-05-26 DIAGNOSIS — I10 Essential (primary) hypertension: Secondary | ICD-10-CM

## 2014-05-26 DIAGNOSIS — F528 Other sexual dysfunction not due to a substance or known physiological condition: Secondary | ICD-10-CM

## 2014-05-26 MED ORDER — TADALAFIL 5 MG PO TABS: 5.0000 mg | ORAL_TABLET | Freq: Every day | ORAL | Status: DC | PRN

## 2014-05-26 MED ORDER — OLMESARTAN MEDOXOMIL-HCTZ 40-25 MG PO TABS
1.0000 | ORAL_TABLET | Freq: Every day | ORAL | Status: DC
Start: 2014-05-26 — End: 2014-07-22

## 2014-05-26 NOTE — Assessment & Plan Note (Signed)
Patient requests a change to Cialis secondary to headaches following taking Viagra. Discontinue Viagra. Start 5 mg Cialis daily as needed.

## 2014-05-26 NOTE — Assessment & Plan Note (Signed)
Blood pressure remains high today. Denies any adverse symptoms or side effects from medications. Increase Benicar HCT to 40-25. Will check kidney function with annual prevention exam with Dr. Yetta BarreJones.

## 2014-05-26 NOTE — Progress Notes (Signed)
   Subjective:    Patient ID: Thomas Berry, male    DOB: April 09, 1971, 43 y.o.   MRN: [id number]  Chief Complaint  Patient presents with  . Medication Refill    Needs BP meds refilled, been over a year since he was last seen    HPI:  Thomas Berry is a 43 y.o. male who presents today for medication refill and follow up on hypertension.   1) Hypertension - Currently maintained on Benicar 40-12.5.  Denies any changes in vision, shortness of breath, chest pain/discomfort, edema or heart palpitations. Last eye exam was about a year and half ago.    BP Readings from Last 3 Encounters:  05/26/14 178/110  11/23/12 154/99  11/22/12 148/100    2) Erectile Dysfunction - currently maintained on Viagra. Indicates that he has a headache following taking the medication. He like to explore Cialis if possible.  Allergies  Allergen Reactions  . Ace Inhibitors     REACTION: cough    Current Outpatient Prescriptions on File Prior to Visit  Medication Sig Dispense Refill  . Beclomethasone Dipropionate (QNASL) 80 MCG/ACT AERS Place 4 puffs into the nose daily. 8.7 g 11  . fexofenadine (ALLEGRA) 180 MG tablet Take 180 mg by mouth daily as needed (for allergies).    . naphazoline-pheniramine (NAPHCON-A) 0.025-0.3 % ophthalmic solution Place 1 drop into both eyes 4 (four) times daily as needed (for dry/red/itchy eyes).    Marland Kitchen. olmesartan-hydrochlorothiazide (BENICAR HCT) 40-12.5 MG per tablet Take 1 tablet by mouth daily. 30 tablet 5  . sildenafil (VIAGRA) 50 MG tablet Take 50 mg by mouth as directed.      . triamcinolone cream (KENALOG) 0.5 % APPLY TOPICALLY 3 TIMES DAILY. 30 g 1   No current facility-administered medications on file prior to visit.    Review of Systems    See HPI Objective:    BP 160/80 mmHg  Pulse 96  Temp(Src) 98.2 F (36.8 C) (Oral)  Resp 18  Ht 6' (1.829 m)  Wt 191 lb (86.637 kg)  BMI 25.90 kg/m2  SpO2 98% Nursing note and vital signs reviewed.  Physical  Exam  Constitutional: He is oriented to person, place, and time. He appears well-developed and well-nourished. No distress.  Cardiovascular: Normal rate, regular rhythm, normal heart sounds and intact distal pulses.   Pulmonary/Chest: Effort normal and breath sounds normal.  Neurological: He is alert and oriented to person, place, and time.  Skin: Skin is warm and dry.  Psychiatric: He has a normal mood and affect. His behavior is normal. Judgment and thought content normal.       Assessment & Plan:

## 2014-05-26 NOTE — Patient Instructions (Addendum)
Thank you for choosing [facility].  Summary/Instructions:  Your prescription(s) have been submitted to your pharmacy. Please take as directed and contact our office if you believe you are having problem(s) with the medication(s).  Hypertension Hypertension, commonly called high blood pressure, is when the force of blood pumping through your arteries is too strong. Your arteries are the blood vessels that carry blood from your heart throughout your body. A blood pressure reading consists of a higher number over a lower number, such as 110/72. The higher number (systolic) is the pressure inside your arteries when your heart pumps. The lower number (diastolic) is the pressure inside your arteries when your heart relaxes. Ideally you want your blood pressure below 120/80. Hypertension forces your heart to work harder to pump blood. Your arteries may become narrow or stiff. Having hypertension puts you at risk for heart disease, stroke, and other problems.  RISK FACTORS Some risk factors for high blood pressure are controllable. Others are not.  Risk factors you cannot control include:   Race. You may be at higher risk if you are African American.  Age. Risk increases with age.  Gender. Men are at higher risk than women before age 45 years. After age 45, women are at higher risk than men. Risk factors you can control include:  Not getting enough exercise or physical activity.  Being overweight.  Getting too much fat, sugar, calories, or salt in your diet.  Drinking too much alcohol. SIGNS AND SYMPTOMS Hypertension does not usually cause signs or symptoms. Extremely high blood pressure (hypertensive crisis) may cause headache, anxiety, shortness of breath, and nosebleed. DIAGNOSIS  To check if you have hypertension, your health care provider will measure your blood pressure while you are seated, with your arm held at the level of your heart. It should be measured at least twice using  the same arm. Certain conditions can cause a difference in blood pressure between your right and left arms. A blood pressure reading that is higher than normal on one occasion does not mean that you need treatment. If one blood pressure reading is high, ask your health care provider about having it checked again. TREATMENT  Treating high blood pressure includes making lifestyle changes and possibly taking medicine. Living a healthy lifestyle can help lower high blood pressure. You may need to change some of your habits. Lifestyle changes may include:  Following the DASH diet. This diet is high in fruits, vegetables, and whole grains. It is low in salt, red meat, and added sugars.  Getting at least 2 hours of brisk physical activity every week.  Losing weight if necessary.  Not smoking.  Limiting alcoholic beverages.  Learning ways to reduce stress. If lifestyle changes are not enough to get your blood pressure under control, your health care provider may prescribe medicine. You may need to take more than one. Work closely with your health care provider to understand the risks and benefits. HOME CARE INSTRUCTIONS  Have your blood pressure rechecked as directed by your health care provider.   Take medicines only as directed by your health care provider. Follow the directions carefully. Blood pressure medicines must be taken as prescribed. The medicine does not work as well when you skip doses. Skipping doses also puts you at risk for problems.   Do not smoke.   Monitor your blood pressure at home as directed by your health care provider. SEEK MEDICAL CARE IF:   You think you are having a reaction to medicines  taken.  You have recurrent headaches or feel dizzy.  You have swelling in your ankles.  You have trouble with your vision. SEEK IMMEDIATE MEDICAL CARE IF:  You develop a severe headache or confusion.  You have unusual weakness, numbness, or feel faint.  You have  severe chest or abdominal pain.  You vomit repeatedly.  You have trouble breathing. MAKE SURE YOU:   Understand these instructions.  Will watch your condition.  Will get help right away if you are not doing well or get worse. Document Released: 05/29/2005 Document Revised: 10/13/2013 Document Reviewed: 03/21/2013 [facility] Patient Information 2015 [employer] [place]. This information is not intended to replace advice given to you by your health care provider. Make sure you discuss any questions you have with your health care provider.

## 2014-05-26 NOTE — Progress Notes (Signed)
Pre visit review using our clinic review tool, if applicable. No additional management support is needed unless otherwise documented below in the visit note. 

## 2014-06-09 ENCOUNTER — Other Ambulatory Visit (INDEPENDENT_AMBULATORY_CARE_PROVIDER_SITE_OTHER): Payer: 59

## 2014-06-09 ENCOUNTER — Encounter: Payer: Self-pay | Admitting: Internal Medicine

## 2014-06-09 ENCOUNTER — Ambulatory Visit (INDEPENDENT_AMBULATORY_CARE_PROVIDER_SITE_OTHER): Payer: 59 | Admitting: Internal Medicine

## 2014-06-09 VITALS — BP 138/88 | HR 71 | Temp 98.8°F | Resp 16 | Ht 72.0 in | Wt 192.0 lb

## 2014-06-09 DIAGNOSIS — I10 Essential (primary) hypertension: Secondary | ICD-10-CM

## 2014-06-09 DIAGNOSIS — F528 Other sexual dysfunction not due to a substance or known physiological condition: Secondary | ICD-10-CM

## 2014-06-09 DIAGNOSIS — R7402 Elevation of levels of lactic acid dehydrogenase (LDH): Secondary | ICD-10-CM

## 2014-06-09 DIAGNOSIS — R7401 Elevation of levels of liver transaminase levels: Secondary | ICD-10-CM

## 2014-06-09 DIAGNOSIS — R740 Nonspecific elevation of levels of transaminase and lactic acid dehydrogenase [LDH]: Secondary | ICD-10-CM

## 2014-06-09 DIAGNOSIS — Z23 Encounter for immunization: Secondary | ICD-10-CM

## 2014-06-09 LAB — BASIC METABOLIC PANEL
BUN: 18 mg/dL (ref 6–23)
CO2: 30 meq/L (ref 19–32)
Calcium: 9.3 mg/dL (ref 8.4–10.5)
Chloride: 102 mEq/L (ref 96–112)
Creatinine, Ser: 0.7 mg/dL (ref 0.4–1.5)
GFR: 151.24 mL/min (ref >60.00)
GLUCOSE: 114 mg/dL — AB (ref 70–99)
POTASSIUM: 4.2 meq/L (ref 3.5–5.1)
Sodium: 138 mEq/L (ref 135–145)

## 2014-06-09 NOTE — Progress Notes (Signed)
   Subjective:    Patient ID: Thomas Berry, male    DOB: May 25, 1971, 43 y.o.   MRN: [id number]  Hypertension This is a chronic problem. The current episode started more than 1 year ago. The problem has been gradually improving since onset. The problem is controlled. Pertinent negatives include no anxiety, blurred vision, chest pain, headaches, malaise/fatigue, neck pain, orthopnea, palpitations, peripheral edema, PND, shortness of breath or sweats. There are no associated agents to hypertension. Past treatments include angiotensin blockers and diuretics. The current treatment provides moderate improvement. There are no compliance problems.       Review of Systems  Constitutional: Negative.  Negative for fever, chills, malaise/fatigue, diaphoresis, appetite change and fatigue.  HENT: Negative.   Eyes: Negative.  Negative for blurred vision.  Respiratory: Negative.  Negative for cough, choking, chest tightness, shortness of breath and stridor.   Cardiovascular: Negative.  Negative for chest pain, palpitations, orthopnea, leg swelling and PND.  Gastrointestinal: Negative.  Negative for nausea, vomiting, abdominal pain, diarrhea, constipation and blood in stool.  Endocrine: Negative.   Genitourinary: Negative.   Musculoskeletal: Negative.  Negative for myalgias, back pain, arthralgias and neck pain.  Skin: Negative.   Allergic/Immunologic: Negative.   Neurological: Negative.  Negative for headaches.  Hematological: Negative.  Negative for adenopathy. Does not bruise/bleed easily.  Psychiatric/Behavioral: Negative.        Objective:   Physical Exam  Constitutional: He is oriented to person, place, and time. He appears well-developed and well-nourished. No distress.  HENT:  Head: Normocephalic and atraumatic.  Mouth/Throat: Oropharynx is clear and moist. No oropharyngeal exudate.  Eyes: Conjunctivae are normal. Right eye exhibits no discharge. Left eye exhibits no discharge. No scleral  icterus.  Neck: Normal range of motion. Neck supple. No JVD present. No tracheal deviation present. No thyromegaly present.  Cardiovascular: Normal rate, regular rhythm, normal heart sounds and intact distal pulses.  Exam reveals no gallop and no friction rub.   No murmur heard. Pulmonary/Chest: Effort normal and breath sounds normal. No stridor. No respiratory distress. He has no wheezes. He has no rales. He exhibits no tenderness.  Abdominal: Soft. Bowel sounds are normal. He exhibits no distension and no mass. There is no tenderness. There is no rebound and no guarding.  Musculoskeletal: Normal range of motion. He exhibits no edema or tenderness.  Lymphadenopathy:    He has no cervical adenopathy.  Neurological: He is oriented to person, place, and time.  Skin: Skin is warm and dry. No rash noted. He is not diaphoretic. No erythema. No pallor.  Vitals reviewed.    Lab Results  Component Value Date   WBC 8.9 07/01/2012   HGB 15.0 07/01/2012   HCT 43.8 07/01/2012   PLT 291.0 07/01/2012   GLUCOSE 117* 10/15/2012   CHOL 193 07/01/2012   TRIG 81.0 07/01/2012   HDL 74.70 07/01/2012   LDLCALC 102* 07/01/2012   ALT 32 10/15/2012   AST 55* 10/15/2012   NA 135 10/15/2012   K 3.4* 10/15/2012   CL 99 10/15/2012   CREATININE 0.8 10/15/2012   BUN 11 10/15/2012   CO2 28 10/15/2012   TSH 0.80 07/01/2012   PSA 0.50 07/01/2012   INR 1.1* 10/15/2012   HGBA1C 4.6 10/15/2012       Assessment & Plan:

## 2014-06-09 NOTE — Patient Instructions (Signed)

## 2014-06-14 NOTE — Assessment & Plan Note (Signed)
This was most likely related to EtOH intake He has not had any alcohol for one month, his LFT's are normal today He may also have fatty liver He is not immune to Hep A or Hep B - he was vaccinated for these today

## 2014-06-14 NOTE — Assessment & Plan Note (Signed)
He will try cialis for this 

## 2014-06-14 NOTE — Assessment & Plan Note (Signed)
His BP is well controlled Lytes and renal function are stable 

## 2014-07-07 ENCOUNTER — Ambulatory Visit: Payer: Self-pay | Admitting: Internal Medicine

## 2014-07-21 ENCOUNTER — Ambulatory Visit: Payer: Self-pay | Admitting: Internal Medicine

## 2014-07-22 ENCOUNTER — Ambulatory Visit (INDEPENDENT_AMBULATORY_CARE_PROVIDER_SITE_OTHER): Payer: 59 | Admitting: Internal Medicine

## 2014-07-22 ENCOUNTER — Encounter: Payer: Self-pay | Admitting: Internal Medicine

## 2014-07-22 VITALS — BP 158/96 | HR 67 | Temp 98.6°F | Resp 16 | Wt 200.0 lb

## 2014-07-22 DIAGNOSIS — R740 Nonspecific elevation of levels of transaminase and lactic acid dehydrogenase [LDH]: Secondary | ICD-10-CM

## 2014-07-22 DIAGNOSIS — I10 Essential (primary) hypertension: Secondary | ICD-10-CM

## 2014-07-22 DIAGNOSIS — R7402 Elevation of levels of lactic acid dehydrogenase (LDH): Secondary | ICD-10-CM

## 2014-07-22 MED ORDER — OLMESARTAN MEDOXOMIL-HCTZ 40-25 MG PO TABS
1.0000 | ORAL_TABLET | Freq: Every day | ORAL | Status: DC
Start: 2014-07-22 — End: 2015-02-18

## 2014-07-22 NOTE — Patient Instructions (Signed)

## 2014-07-22 NOTE — Progress Notes (Signed)
Pre visit review using our clinic review tool, if applicable. No additional management support is needed unless otherwise documented below in the visit note. 

## 2014-07-22 NOTE — Progress Notes (Signed)
   Subjective:    Patient ID: Thomas Berry, male    DOB: 21-Feb-1971, 44 y.o.   MRN: [id number]  Hypertension This is a chronic problem. The current episode started more than 1 year ago. The problem is unchanged. The problem is uncontrolled. Pertinent negatives include no anxiety, blurred vision, chest pain, headaches, malaise/fatigue, neck pain, orthopnea, palpitations, peripheral edema, PND, shortness of breath or sweats. Past treatments include angiotensin blockers and diuretics. Compliance problems include diet, exercise and psychosocial issues.       Review of Systems  Constitutional: Negative for fever, chills, malaise/fatigue, diaphoresis, appetite change and fatigue.  HENT: Negative.   Eyes: Negative.  Negative for blurred vision.  Respiratory: Negative.  Negative for cough, choking, chest tightness, shortness of breath and stridor.   Cardiovascular: Negative.  Negative for chest pain, palpitations, orthopnea, leg swelling and PND.  Gastrointestinal: Negative.  Negative for abdominal pain, diarrhea, constipation and blood in stool.  Endocrine: Negative.   Genitourinary: Negative.   Musculoskeletal: Negative.  Negative for neck pain.  Skin: Negative.   Allergic/Immunologic: Negative.   Neurological: Negative.  Negative for headaches.  Hematological: Negative.   Psychiatric/Behavioral: Negative.        Objective:   Physical Exam  Constitutional: He is oriented to person, place, and time. He appears well-developed and well-nourished. No distress.  HENT:  Head: Normocephalic and atraumatic.  Mouth/Throat: Oropharynx is clear and moist. No oropharyngeal exudate.  Eyes: Conjunctivae are normal. Right eye exhibits no discharge. Left eye exhibits no discharge. No scleral icterus.  Neck: Normal range of motion. Neck supple. No JVD present. No tracheal deviation present. No thyromegaly present.  Cardiovascular: Normal rate, regular rhythm, normal heart sounds and intact distal  pulses.  Exam reveals no gallop and no friction rub.   No murmur heard. Pulmonary/Chest: Effort normal and breath sounds normal. No stridor. No respiratory distress. He has no wheezes. He has no rales. He exhibits no tenderness.  Abdominal: Soft. Bowel sounds are normal. He exhibits no distension and no mass. There is no tenderness. There is no rebound and no guarding.  Musculoskeletal: Normal range of motion. He exhibits no edema or tenderness.  Lymphadenopathy:    He has no cervical adenopathy.  Neurological: He is oriented to person, place, and time.  Skin: Skin is warm and dry. No rash noted. He is not diaphoretic. No erythema. No pallor.  Nursing note and vitals reviewed.    Lab Results  Component Value Date   WBC 8.9 07/01/2012   HGB 15.0 07/01/2012   HCT 43.8 07/01/2012   PLT 291.0 07/01/2012   GLUCOSE 114* 06/09/2014   CHOL 193 07/01/2012   TRIG 81.0 07/01/2012   HDL 74.70 07/01/2012   LDLCALC 102* 07/01/2012   ALT 32 10/15/2012   AST 55* 10/15/2012   NA 138 06/09/2014   K 4.2 06/09/2014   CL 102 06/09/2014   CREATININE 0.7 06/09/2014   BUN 18 06/09/2014   CO2 30 06/09/2014   TSH 0.80 07/01/2012   PSA 0.50 07/01/2012   INR 1.1* 10/15/2012   HGBA1C 4.6 10/15/2012       Assessment & Plan:

## 2014-07-24 ENCOUNTER — Telehealth: Payer: Self-pay | Admitting: Internal Medicine

## 2014-07-24 NOTE — Telephone Encounter (Signed)
emmi mailed  °

## 2014-07-27 ENCOUNTER — Encounter: Payer: Self-pay | Admitting: Internal Medicine

## 2014-07-27 NOTE — Assessment & Plan Note (Signed)
He admits to some non-compliance with his meds and therefore his BP is not adequately well controlled His recent lytes and renal function were normal Will restart benicar-hct He will work on his lifestyle modifications

## 2014-07-27 NOTE — Assessment & Plan Note (Signed)
This appears to be fatty liver disease Will follow for now

## 2014-07-27 NOTE — Addendum Note (Signed)
Addended by: Etta GrandchildJONES, Kahli L on: 07/27/2014 06:31 PM   Modules accepted: Kipp BroodSmartSet

## 2014-09-16 ENCOUNTER — Other Ambulatory Visit: Payer: Self-pay | Admitting: Internal Medicine

## 2014-09-16 DIAGNOSIS — J309 Allergic rhinitis, unspecified: Secondary | ICD-10-CM

## 2014-09-16 NOTE — Telephone Encounter (Signed)
Patient needs refill for fexofenadine (ALLEGRA) 180 MG tablet [[id number]][[id number]] and Beclomethasone Dipropionate (QNASL) 80 MCG/ACT AERS [[id number]][[id number]] . [facility]

## 2014-09-17 MED ORDER — FEXOFENADINE HCL 180 MG PO TABS: 180.0000 mg | ORAL_TABLET | Freq: Every day | ORAL | Status: DC | PRN

## 2014-09-17 MED ORDER — BECLOMETHASONE DIPROPIONATE 80 MCG/ACT NA AERS: 4.0000 | INHALATION_SPRAY | Freq: Every day | NASAL | Status: DC

## 2014-09-17 NOTE — Telephone Encounter (Signed)
Is it okay to fill the Qnasl. Not filled since 2014? Pended for your review.

## 2015-02-18 ENCOUNTER — Other Ambulatory Visit: Payer: Self-pay | Admitting: Internal Medicine

## 2015-08-12 MED FILL — HYDROCODON-APAP 5-325: 5-325 | 2 days supply | Qty: 21 | Fill #0

## 2015-09-16 MED FILL — QNASL 80 MCG NASAL SPRAY: 80 | 30 days supply | Qty: 9 | Fill #1

## 2015-09-22 ENCOUNTER — Telehealth: Payer: Self-pay | Admitting: Internal Medicine

## 2015-09-22 ENCOUNTER — Telehealth: Payer: Self-pay

## 2015-09-22 NOTE — Telephone Encounter (Signed)
Pt was wondering if Dr. Yetta BarreJones will send in some BENICAR HCT 40-25 MG per tablet to [facility] until he comes in on 09/27/15 ( he has 2 pill left).

## 2015-09-22 NOTE — Telephone Encounter (Signed)
Note, he has not been seen in over a year.

## 2015-09-22 NOTE — Telephone Encounter (Signed)
Recd faxed rx request for olmesartan-hctz 40-25mg  tab from [place] outpatient pharm----i'm not showing this on patient's current med list---are you ok with refilling---please advise, thanks

## 2015-09-23 MED ORDER — OLMESARTAN MEDOXOMIL-HCTZ 40-25 MG PO TABS: 1.0000 | ORAL_TABLET | Freq: Every day | ORAL | Status: DC

## 2015-09-23 MED FILL — OLMESARTAN-HCTZ 40-25 MG TA: 40-25 | 30 days supply | Qty: 30 | Fill #0

## 2015-09-23 NOTE — Telephone Encounter (Signed)
Left massage for pt

## 2015-09-23 NOTE — Telephone Encounter (Signed)
done

## 2015-09-27 ENCOUNTER — Ambulatory Visit (INDEPENDENT_AMBULATORY_CARE_PROVIDER_SITE_OTHER): Payer: 59 | Admitting: Internal Medicine

## 2015-09-27 ENCOUNTER — Other Ambulatory Visit (INDEPENDENT_AMBULATORY_CARE_PROVIDER_SITE_OTHER): Payer: 59

## 2015-09-27 ENCOUNTER — Encounter: Payer: Self-pay | Admitting: Internal Medicine

## 2015-09-27 VITALS — BP 166/98 | HR 89 | Temp 99.2°F | Resp 16 | Ht 72.0 in | Wt 187.0 lb

## 2015-09-27 DIAGNOSIS — R739 Hyperglycemia, unspecified: Secondary | ICD-10-CM

## 2015-09-27 DIAGNOSIS — R740 Nonspecific elevation of levels of transaminase and lactic acid dehydrogenase [LDH]: Secondary | ICD-10-CM | POA: Diagnosis not present

## 2015-09-27 DIAGNOSIS — J309 Allergic rhinitis, unspecified: Secondary | ICD-10-CM | POA: Diagnosis not present

## 2015-09-27 DIAGNOSIS — R7401 Elevation of levels of liver transaminase levels: Secondary | ICD-10-CM

## 2015-09-27 DIAGNOSIS — I10 Essential (primary) hypertension: Secondary | ICD-10-CM | POA: Diagnosis not present

## 2015-09-27 DIAGNOSIS — Z0000 Encounter for general adult medical examination without abnormal findings: Secondary | ICD-10-CM | POA: Diagnosis not present

## 2015-09-27 DIAGNOSIS — R7402 Elevation of levels of lactic acid dehydrogenase (LDH): Secondary | ICD-10-CM

## 2015-09-27 DIAGNOSIS — J4520 Mild intermittent asthma, uncomplicated: Secondary | ICD-10-CM

## 2015-09-27 DIAGNOSIS — J301 Allergic rhinitis due to pollen: Secondary | ICD-10-CM

## 2015-09-27 LAB — CBC WITH DIFFERENTIAL/PLATELET
BASOS PCT: 0.4 % (ref 0.0–3.0)
Basophils Absolute: 0 10*3/uL (ref 0.0–0.1)
EOS ABS: 0.3 10*3/uL (ref 0.0–0.7)
EOS PCT: 5.5 % — AB (ref 0.0–5.0)
HEMATOCRIT: 41.1 % (ref 39.0–52.0)
HEMOGLOBIN: 14.3 g/dL (ref 13.0–17.0)
LYMPHS PCT: 21.8 % (ref 12.0–46.0)
Lymphs Abs: 1.3 10*3/uL (ref 0.7–4.0)
MCHC: 34.8 g/dL (ref 30.0–36.0)
MCV: 98.2 fl (ref 78.0–100.0)
Monocytes Absolute: 0.5 10*3/uL (ref 0.1–1.0)
Monocytes Relative: 9.1 % (ref 3.0–12.0)
NEUTROS ABS: 3.7 10*3/uL (ref 1.4–7.7)
Neutrophils Relative %: 63.2 % (ref 43.0–77.0)
PLATELETS: 217 10*3/uL (ref 150.0–400.0)
RBC: 4.18 Mil/uL — ABNORMAL LOW (ref 4.22–5.81)
RDW: 12.4 % (ref 11.5–15.5)
WBC: 5.9 10*3/uL (ref 4.0–10.5)

## 2015-09-27 LAB — COMPREHENSIVE METABOLIC PANEL
ALT: 44 U/L (ref 0–53)
AST: 48 U/L — AB (ref 0–37)
Albumin: 4.6 g/dL (ref 3.5–5.2)
Alkaline Phosphatase: 48 U/L (ref 39–117)
BUN: 9 mg/dL (ref 6–23)
CALCIUM: 9.9 mg/dL (ref 8.4–10.5)
CHLORIDE: 100 meq/L (ref 96–112)
CO2: 28 meq/L (ref 19–32)
CREATININE: 0.75 mg/dL (ref 0.40–1.50)
GFR: 143.46 mL/min (ref >60.00)
Glucose, Bld: 122 mg/dL — ABNORMAL HIGH (ref 70–99)
POTASSIUM: 3.6 meq/L (ref 3.5–5.1)
SODIUM: 140 meq/L (ref 135–145)
Total Bilirubin: 0.8 mg/dL (ref 0.2–1.2)
Total Protein: 8.1 g/dL (ref 6.0–8.3)

## 2015-09-27 LAB — LIPID PANEL
CHOL/HDL RATIO: 3
Cholesterol: 215 mg/dL — ABNORMAL HIGH (ref 0–200)
HDL: 70.5 mg/dL (ref >39.00)
LDL CALC: 124 mg/dL — AB (ref 0–99)
NonHDL: 144.43
Triglycerides: 104 mg/dL (ref 0.0–149.0)
VLDL: 20.8 mg/dL (ref 0.0–40.0)

## 2015-09-27 LAB — HEPATITIS A ANTIBODY, TOTAL: HEP A TOTAL AB: NONREACTIVE

## 2015-09-27 LAB — HEPATITIS B CORE ANTIBODY, TOTAL: HEP B C TOTAL AB: NONREACTIVE

## 2015-09-27 LAB — URINALYSIS, ROUTINE W REFLEX MICROSCOPIC
Bilirubin Urine: NEGATIVE
Ketones, ur: NEGATIVE
Leukocytes, UA: NEGATIVE
NITRITE: NEGATIVE
RBC / HPF: NONE SEEN (ref 0–?)
SPECIFIC GRAVITY, URINE: 1.02 (ref 1.000–1.030)
Total Protein, Urine: NEGATIVE
Urine Glucose: NEGATIVE
Urobilinogen, UA: 0.2 (ref 0.0–1.0)
WBC UA: NONE SEEN (ref 0–?)
pH: 5.5 (ref 5.0–8.0)

## 2015-09-27 LAB — TSH: TSH: 1.29 u[IU]/mL (ref 0.35–4.50)

## 2015-09-27 LAB — HEPATITIS B SURFACE ANTIBODY,QUALITATIVE: HEP B S AB: NEGATIVE

## 2015-09-27 LAB — FECAL OCCULT BLOOD, GUAIAC: Fecal Occult Blood: NEGATIVE

## 2015-09-27 LAB — PSA: PSA: 0.65 ng/mL (ref 0.10–4.00)

## 2015-09-27 MED ORDER — OLMESARTAN MEDOXOMIL-HCTZ 40-25 MG PO TABS: 1.0000 | ORAL_TABLET | Freq: Every day | ORAL | Status: DC

## 2015-09-27 MED ORDER — METHYLPREDNISOLONE ACETATE 80 MG/ML IJ SUSP
120.0000 mg | Freq: Once | INTRAMUSCULAR | Status: AC
  Administered 2015-09-27: 120 mg via INTRAMUSCULAR

## 2015-09-27 MED ORDER — FEXOFENADINE HCL 180 MG PO TABS: 180.0000 mg | ORAL_TABLET | Freq: Every day | ORAL | Status: DC | PRN

## 2015-09-27 MED ORDER — BECLOMETHASONE DIPROPIONATE 80 MCG/ACT NA AERS: 4.0000 | INHALATION_SPRAY | Freq: Every day | NASAL | Status: DC

## 2015-09-27 NOTE — Patient Instructions (Signed)
Hypertension Hypertension, commonly called high blood pressure, is when the force of blood pumping through your arteries is too strong. Your arteries are the blood vessels that carry blood from your heart throughout your body. A blood pressure reading consists of a higher number over a lower number, such as 110/72. The higher number (systolic) is the pressure inside your arteries when your heart pumps. The lower number (diastolic) is the pressure inside your arteries when your heart relaxes. Ideally you want your blood pressure below 120/80. Hypertension forces your heart to work harder to pump blood. Your arteries may become narrow or stiff. Having untreated or uncontrolled hypertension can cause heart attack, stroke, kidney disease, and other problems. RISK FACTORS Some risk factors for high blood pressure are controllable. Others are not.  Risk factors you cannot control include:   Race. You may be at higher risk if you are African American.  Age. Risk increases with age.  Gender. Men are at higher risk than women before age 45 years. After age 65, women are at higher risk than men. Risk factors you can control include:  Not getting enough exercise or physical activity.  Being overweight.  Getting too much fat, sugar, calories, or salt in your diet.  Drinking too much alcohol. SIGNS AND SYMPTOMS Hypertension does not usually cause signs or symptoms. Extremely high blood pressure (hypertensive crisis) may cause headache, anxiety, shortness of breath, and nosebleed. DIAGNOSIS To check if you have hypertension, your health care provider will measure your blood pressure while you are seated, with your arm held at the level of your heart. It should be measured at least twice using the same arm. Certain conditions can cause a difference in blood pressure between your right and left arms. A blood pressure reading that is higher than normal on one occasion does not mean that you need treatment. If  it is not clear whether you have high blood pressure, you may be asked to return on a different day to have your blood pressure checked again. Or, you may be asked to monitor your blood pressure at home for 1 or more weeks. TREATMENT Treating high blood pressure includes making lifestyle changes and possibly taking medicine. Living a healthy lifestyle can help lower high blood pressure. You may need to change some of your habits. Lifestyle changes may include:  Following the DASH diet. This diet is high in fruits, vegetables, and whole grains. It is low in salt, red meat, and added sugars.  Keep your sodium intake below 2,300 mg per day.  Getting at least 30-45 minutes of aerobic exercise at least 4 times per week.  Losing weight if necessary.  Not smoking.  Limiting alcoholic beverages.  Learning ways to reduce stress. Your health care provider may prescribe medicine if lifestyle changes are not enough to get your blood pressure under control, and if one of the following is true:  You are 18-59 years of age and your systolic blood pressure is above 140.  You are 60 years of age or older, and your systolic blood pressure is above 150.  Your diastolic blood pressure is above 90.  You have diabetes, and your systolic blood pressure is over 140 or your diastolic blood pressure is over 90.  You have kidney disease and your blood pressure is above 140/90.  You have heart disease and your blood pressure is above 140/90. Your personal target blood pressure may vary depending on your medical conditions, your age, and other factors. HOME CARE INSTRUCTIONS    Have your blood pressure rechecked as directed by your health care provider.   Take medicines only as directed by your health care provider. Follow the directions carefully. Blood pressure medicines must be taken as prescribed. The medicine does not work as well when you skip doses. Skipping doses also puts you at risk for  problems.  Do not smoke.   Monitor your blood pressure at home as directed by your health care provider. SEEK MEDICAL CARE IF:   You think you are having a reaction to medicines taken.  You have recurrent headaches or feel dizzy.  You have swelling in your ankles.  You have trouble with your vision. SEEK IMMEDIATE MEDICAL CARE IF:  You develop a severe headache or confusion.  You have unusual weakness, numbness, or feel faint.  You have severe chest or abdominal pain.  You vomit repeatedly.  You have trouble breathing. MAKE SURE YOU:   Understand these instructions.  Will watch your condition.  Will get help right away if you are not doing well or get worse.   This information is not intended to replace advice given to you by your health care provider. Make sure you discuss any questions you have with your health care provider.   Document Released: 05/29/2005 Document Revised: 10/13/2014 Document Reviewed: 03/21/2013 Elsevier Interactive Patient Education 2016 [employer]  

## 2015-09-27 NOTE — Progress Notes (Signed)
Subjective:  Patient ID: Thomas Berry, male    DOB: July 15, 1970  Age: 45 y.o. MRN: [id number]  CC: Allergic Rhinitis ; Hypertension; and Annual Exam   HPI DAEMIEN FRONCZAK presents for a CPX - he tells me that his blood pressure has not been well controlled over the last week or 2 because he ran out of his blood pressure medicine about a week ago. Other than that he says his blood pressure has been well controlled and he denies headache, blurred vision, chest pain, shortness of breath, dyspnea on exertion, palpitations, or fatigue.  He complains of several weeks of allergy symptoms with nasal congestion, sneezing, runny nose. He denies facial pain, sore throat, fever, chills, or earaches.  Outpatient Prescriptions Prior to Visit  Medication Sig Dispense Refill  . Beclomethasone Dipropionate (QNASL) 80 MCG/ACT AERS Place 4 puffs into the nose daily. 8.7 g 11  . olmesartan-hydrochlorothiazide (BENICAR HCT) 40-25 MG tablet Take 1 tablet by mouth daily. 30 tablet 0  . fexofenadine (ALLEGRA) 180 MG tablet Take 1 tablet (180 mg total) by mouth daily as needed (for allergies). (Patient not taking: Reported on 09/27/2015) 90 tablet 1  . naphazoline-pheniramine (NAPHCON-A) 0.025-0.3 % ophthalmic solution Place 1 drop into both eyes 4 (four) times daily as needed (for dry/red/itchy eyes). Reported on 09/27/2015    . tadalafil (CIALIS) 5 MG tablet Take 1 tablet (5 mg total) by mouth daily as needed for erectile dysfunction. (Patient not taking: Reported on 09/27/2015) 10 tablet 0  . triamcinolone cream (KENALOG) 0.5 % APPLY TOPICALLY 3 TIMES DAILY. (Patient not taking: Reported on 09/27/2015) 30 g 1   No facility-administered medications prior to visit.    ROS Review of Systems  Constitutional: Negative.  Negative for fever, chills, diaphoresis, appetite change and fatigue.  HENT: Positive for congestion, postnasal drip, rhinorrhea and sneezing. Negative for facial swelling, sinus pressure, sore  throat, tinnitus, trouble swallowing and voice change.   Eyes: Negative.   Respiratory: Negative.  Negative for cough, choking, chest tightness, shortness of breath and stridor.   Cardiovascular: Negative.  Negative for chest pain, palpitations and leg swelling.  Gastrointestinal: Negative.  Negative for nausea, vomiting, abdominal pain, diarrhea and constipation.  Endocrine: Negative.   Genitourinary: Negative.   Musculoskeletal: Negative.  Negative for myalgias, back pain and joint swelling.  Skin: Negative.  Negative for pallor and rash.  Allergic/Immunologic: Negative.   Neurological: Negative.  Negative for dizziness, tremors, weakness, light-headedness, numbness and headaches.  Hematological: Negative.  Negative for adenopathy. Does not bruise/bleed easily.  Psychiatric/Behavioral: Negative.     Objective:  BP 166/98 mmHg  Pulse 89  Temp(Src) 99.2 F (37.3 C) (Oral)  Resp 16  Ht 6' (1.829 m)  Wt 187 lb (84.823 kg)  BMI 25.36 kg/m2  SpO2 97%  BP Readings from Last 3 Encounters:  09/27/15 166/98  07/22/14 158/96  06/09/14 138/88    Wt Readings from Last 3 Encounters:  09/27/15 187 lb (84.823 kg)  07/22/14 200 lb (90.719 kg)  06/09/14 192 lb (87.091 kg)    Physical Exam  Constitutional: He is oriented to person, place, and time. He appears well-developed and well-nourished. No distress.  HENT:  Nose: Mucosal edema and rhinorrhea present. No sinus tenderness. Right sinus exhibits no maxillary sinus tenderness and no frontal sinus tenderness.  Mouth/Throat: Oropharynx is clear and moist and mucous membranes are normal. Mucous membranes are not pale, not dry and not cyanotic. No oral lesions. No trismus in the jaw. No uvula swelling.  No oropharyngeal exudate, posterior oropharyngeal edema, posterior oropharyngeal erythema or tonsillar abscesses.  Eyes: Conjunctivae are normal. Right eye exhibits no discharge. Left eye exhibits no discharge. No scleral icterus.  Neck:  Normal range of motion. Neck supple. No JVD present. No tracheal deviation present. No thyromegaly present.  Cardiovascular: Normal rate, regular rhythm, normal heart sounds and intact distal pulses.  Exam reveals no gallop and no friction rub.   No murmur heard. Pulses:      Carotid pulses are 1+ on the right side, and 1+ on the left side.      Radial pulses are 1+ on the right side, and 1+ on the left side.       Femoral pulses are 1+ on the right side, and 1+ on the left side.      Popliteal pulses are 1+ on the right side, and 1+ on the left side.       Dorsalis pedis pulses are 1+ on the right side, and 1+ on the left side.       Posterior tibial pulses are 1+ on the right side, and 1+ on the left side.  EKG ----  Sinus  Rhythm  WITHIN NORMAL LIMITS  Pulmonary/Chest: Effort normal and breath sounds normal. No stridor. No respiratory distress. He has no wheezes. He has no rales. He exhibits no tenderness.  Abdominal: Soft. Bowel sounds are normal. He exhibits no distension and no mass. There is no tenderness. There is no rebound and no guarding. Hernia confirmed negative in the right inguinal area and confirmed negative in the left inguinal area.  Genitourinary: Rectum normal, testes normal and penis normal. Rectal exam shows no external hemorrhoid, no internal hemorrhoid, no fissure, no mass, no tenderness and anal tone normal. Guaiac negative stool. Prostate is enlarged (2+ smooth symm BPH). Prostate is not tender. Right testis shows no mass, no swelling and no tenderness. Right testis is descended. Left testis shows no mass, no swelling and no tenderness. Left testis is descended. Circumcised. No penile erythema or penile tenderness. No discharge found.  Musculoskeletal: Normal range of motion. He exhibits no edema or tenderness.  Lymphadenopathy:    He has no cervical adenopathy.       Right: No inguinal adenopathy present.       Left: No inguinal adenopathy present.  Neurological: He  is oriented to person, place, and time.  Skin: Skin is warm and dry. No rash noted. He is not diaphoretic. No erythema. No pallor.  Psychiatric: He has a normal mood and affect. His behavior is normal. Judgment and thought content normal.  Vitals reviewed.   Lab Results  Component Value Date   WBC 5.9 09/27/2015   HGB 14.3 09/27/2015   HCT 41.1 09/27/2015   PLT 217.0 09/27/2015   GLUCOSE 122* 09/27/2015   CHOL 215* 09/27/2015   TRIG 104.0 09/27/2015   HDL 70.50 09/27/2015   LDLCALC 124* 09/27/2015   ALT 44 09/27/2015   AST 48* 09/27/2015   NA 140 09/27/2015   K 3.6 09/27/2015   CL 100 09/27/2015   CREATININE 0.75 09/27/2015   BUN 9 09/27/2015   CO2 28 09/27/2015   TSH 1.29 09/27/2015   PSA 0.65 09/27/2015   INR 1.1* 10/15/2012   HGBA1C 4.6 10/15/2012    No results found.  Assessment & Plan:   Rolen was seen today for allergic rhinitis , hypertension and annual exam.  Diagnoses and all orders for this visit:  Essential hypertension- his EKG is negative for  left ventricular hypertrophy, his blood pressure is not well controlled due to noncompliance, will restart the ARB/HCTZ combination, his electrolytes and renal function are stable. -     olmesartan-hydrochlorothiazide (BENICAR HCT) 40-25 MG tablet; Take 1 tablet by mouth daily. -     EKG 12-Lead  Hyperglycemia- his blood sugar is slightly elevated, I will recheck his A1c the next time I see him.  Nonspecific elevation of levels of transaminase or lactic acid dehydrogenase (LDH)- his AST is minimally elevated, this is most likely fatty liver disease, he has no antibodies for hep A and hep B so I have asked him to return to start the vaccine process against hep A and hep B. -     Hepatitis A antibody, total; Future -     Hepatitis B core antibody, total; Future -     Hepatitis B surface antibody; Future  Allergic rhinitis due to pollen- he is having a flareup of this I gave him an injection of Depo-Medrol, will also  restart the antihistamine and steroid nasal spray. -     fexofenadine (ALLEGRA) 180 MG tablet; Take 1 tablet (180 mg total) by mouth daily as needed (for allergies). -     Beclomethasone Dipropionate (QNASL) 80 MCG/ACT AERS; Place 4 puffs into the nose daily. -     methylPREDNISolone acetate (DEPO-MEDROL) injection 120 mg; Inject 1.5 mLs (120 mg total) into the muscle once.  Asthma, mild intermittent, uncomplicated- he is having no symptoms related to this and does not want to start using a an inhaler  Routine general medical examination at a health care facility- exam completed, labs ordered and reviewed, vaccines reviewed and updated, patient education material was given. -     Lipid panel; Future -     Comprehensive metabolic panel; Future -     CBC with Differential/Platelet; Future -     TSH; Future -     Urinalysis, Routine w reflex microscopic ([facility]); Future -     PSA; Future  Allergic rhinitis, unspecified allergic rhinitis type  I have discontinued Mr. Cornelious BryantLevette's naphazoline-pheniramine, triamcinolone cream, and tadalafil. I am also having him maintain his fexofenadine, olmesartan-hydrochlorothiazide, and Beclomethasone Dipropionate. We administered methylPREDNISolone acetate.  Meds ordered this encounter  Medications  . fexofenadine (ALLEGRA) 180 MG tablet    Sig: Take 1 tablet (180 mg total) by mouth daily as needed (for allergies).    Dispense:  90 tablet    Refill:  1  . olmesartan-hydrochlorothiazide (BENICAR HCT) 40-25 MG tablet    Sig: Take 1 tablet by mouth daily.    Dispense:  90 tablet    Refill:  1  . Beclomethasone Dipropionate (QNASL) 80 MCG/ACT AERS    Sig: Place 4 puffs into the nose daily.    Dispense:  8.7 g    Refill:  11  . methylPREDNISolone acetate (DEPO-MEDROL) injection 120 mg    Sig:      Follow-up: Return in about 2 months (around 11/27/2015).  Sanda Lingerhomas Scotty Pinder, MD

## 2015-09-27 NOTE — Progress Notes (Signed)
Pre visit review using our clinic review tool, if applicable. No additional management support is needed unless otherwise documented below in the visit note. 

## 2015-09-28 ENCOUNTER — Encounter: Payer: Self-pay | Admitting: Internal Medicine

## 2015-10-28 MED FILL — OLMESARTAN-HCTZ 40-25 MG TA: 40-25 | 90 days supply | Qty: 90 | Fill #0

## 2016-02-17 MED FILL — OLMESARTAN-HCTZ 40-25 MG TA: 40-25 | 90 days supply | Qty: 90 | Fill #1

## 2016-06-14 ENCOUNTER — Encounter (HOSPITAL_COMMUNITY): Payer: Self-pay | Admitting: Emergency Medicine

## 2016-06-14 ENCOUNTER — Emergency Department (HOSPITAL_COMMUNITY): Payer: 59

## 2016-06-14 ENCOUNTER — Emergency Department (HOSPITAL_COMMUNITY)
Admission: EM | Admit: 2016-06-14 | Discharge: 2016-06-15 | Disposition: A | Discharge status: Home / Self Care | Payer: 59 | Attending: Emergency Medicine | Admitting: Emergency Medicine

## 2016-06-14 DIAGNOSIS — I10 Essential (primary) hypertension: Secondary | ICD-10-CM | POA: Insufficient documentation

## 2016-06-14 DIAGNOSIS — M545 Low back pain: Secondary | ICD-10-CM | POA: Diagnosis not present

## 2016-06-14 DIAGNOSIS — J45909 Unspecified asthma, uncomplicated: Secondary | ICD-10-CM | POA: Diagnosis not present

## 2016-06-14 DIAGNOSIS — M5432 Sciatica, left side: Secondary | ICD-10-CM

## 2016-06-14 DIAGNOSIS — M5442 Lumbago with sciatica, left side: Secondary | ICD-10-CM | POA: Diagnosis not present

## 2016-06-14 MED ORDER — CYCLOBENZAPRINE HCL 10 MG PO TABS
10.0000 mg | ORAL_TABLET | Freq: Once | ORAL | Status: AC
Start: 2016-06-14 — End: 2016-06-14
  Administered 2016-06-14: 10 mg via ORAL
  Filled 2016-06-14: qty 1

## 2016-06-14 NOTE — ED Notes (Signed)
Patient transported to X-ray 

## 2016-06-14 NOTE — ED Triage Notes (Signed)
Pt presents to ED for assessment of left sided lower back with radiation down back of left leg.  Sts pain has been intermittent x 2 years, but has worsened x 2 days.

## 2016-06-14 NOTE — ED Provider Notes (Signed)
[facility]-EMERGENCY DEPT Provider Note   CSN: [id number] Arrival date & time: 06/14/16  2047  By signing my name below, I, Teofilo Pod, attest that this documentation has been prepared under the direction and in the presence of Kerrie Buffalo, NP. Electronically Signed: Teofilo Pod, ED Scribe. 06/14/2016. 11:27 PM.    History   Chief Complaint Chief Complaint  Patient presents with  . Back Pain    The history is provided by the patient. No language interpreter was used.  Back Pain   This is a chronic problem. The problem occurs daily. The problem has been gradually worsening. The pain is associated with no known injury. The pain is present in the lumbar spine. The pain radiates to the left thigh. The pain is moderate. Treatments tried: ibuprofen. The treatment provided mild relief.   HPI Comments:  Thomas Berry is a 46 y.o. male who presents to the Emergency Department complaining of chronic, worsening back pain that has worsened since yesterday. Pt reports that he has had chronic back pain for 2 years. Pt states that this is the first time he has seen anyone about his pain. Pt states that the pain is primarily in his lower back and radiates down his left leg. Pt is unsure if he injured his back. Pt complains of associated associated chills. Pt takes ibuprofen with mild relief regularly, but has not taken it in 4 days. Pt denies nausea, vomiting, fever, urinary symptoms.   Past Medical History:  Diagnosis Date  . Abnormal EKG    09/20/09 LVH in limb leads  . Asthma   . ED (erectile dysfunction)   . Glaucoma   . HTN (hypertension)   . Hypertrophy of prostate with urinary obstruction and other lower urinary tract symptoms (LUTS)   . Other and unspecified alcohol dependence, episodic drinking behavior   . Routine general medical examination at a health care facility     Patient Active Problem List   Diagnosis Date Noted  . Nonspecific elevation of levels of transaminase or  lactic acid dehydrogenase (LDH) 10/15/2012  . Routine general medical examination at a health care facility 07/01/2012  . Eczema 07/01/2012  . Hyperglycemia 05/24/2011  . HTN (hypertension) 08/31/2010  . Allergic rhinitis 08/31/2010  . HYPERTROPHY PROSTATE W/UR OBST & OTH LUTS 07/08/2009  . Asthma 04/19/2009    History reviewed. No pertinent surgical history.     Home Medications    Prior to Admission medications   Medication Sig Start Date End Date Taking? Authorizing Provider  Beclomethasone Dipropionate (QNASL) 80 MCG/ACT AERS Place 4 puffs into the nose daily. 09/27/15   Etta Grandchild, MD  diclofenac (VOLTAREN) 50 MG EC tablet Take 1 tablet (50 mg total) by mouth 2 (two) times daily. 06/15/16   Braedyn Kauk Orlene Och, NP  fexofenadine (ALLEGRA) 180 MG tablet Take 1 tablet (180 mg total) by mouth daily as needed (for allergies). 09/27/15   Etta Grandchild, MD  methocarbamol (ROBAXIN) 500 MG tablet Take 1 tablet (500 mg total) by mouth 2 (two) times daily. 06/15/16   Jibreel Fedewa Orlene Och, NP  olmesartan-hydrochlorothiazide (BENICAR HCT) 40-25 MG tablet Take 1 tablet by mouth daily. 09/27/15   Etta Grandchild, MD    Family History Family History  Problem Relation Age of Onset  . Hypertension Other   . Stroke Other   . Cancer Neg Hx   . Alcohol abuse Neg Hx   . Diabetes Neg Hx   . Early death Neg Hx   .  Heart disease Neg Hx   . Hyperlipidemia Neg Hx   . Kidney disease Neg Hx     Social History Social History  Substance Use Topics  . Smoking status: Never Smoker  . Smokeless tobacco: Never Used  . Alcohol use 1.8 oz/week    3 Cans of beer per week     Allergies   Ace inhibitors   Review of Systems Review of Systems  Musculoskeletal: Positive for back pain.  all other systems negative   Physical Exam Updated Vital Signs BP 134/79 (BP Location: Right Arm)   Pulse 84   Temp 98.7 F (37.1 C) (Oral)   Resp 16   Ht 6' (1.829 m)   Wt 85.3 kg   SpO2 99%   BMI 25.50 kg/m    Physical Exam  Constitutional: He is oriented to person, place, and time. He appears well-developed and well-nourished. No distress.  HENT:  Head: Normocephalic and atraumatic.  Eyes: Conjunctivae and EOM are normal.  Neck: Normal range of motion. Neck supple.  Cardiovascular: Normal rate, regular rhythm and intact distal pulses.   Radial pulses 2+  Pulmonary/Chest: Effort normal and breath sounds normal. No respiratory distress.  No CVA tenderness  Abdominal: Soft. Bowel sounds are normal. He exhibits no distension. There is no tenderness.  Musculoskeletal: Normal range of motion. He exhibits no edema.       Lumbar back: He exhibits tenderness and spasm. He exhibits normal range of motion, no deformity and normal pulse.  Tenderness over left lumbar area radiating to left sciatic nerve.   Neurological: He is alert and oriented to person, place, and time. He has normal strength. No sensory deficit. Gait normal.  Reflex Scores:      Bicep reflexes are 2+ on the right side and 2+ on the left side.      Brachioradialis reflexes are 2+ on the right side and 2+ on the left side.      Patellar reflexes are 2+ on the right side and 2+ on the left side. Grip is normal. Reflexes normal and symmetric. Straight leg raises without difficulty.   Skin: Skin is warm and dry.  Psychiatric: He has a normal mood and affect. His behavior is normal.  Nursing note and vitals reviewed.    ED Treatments / Results  DIAGNOSTIC STUDIES:  Oxygen Saturation is 96% on RA, normal by my interpretation.    COORDINATION OF CARE:  11:27 PM Discussed treatment plan with pt at bedside and pt agreed to plan.   Labs (all labs ordered are listed, but only abnormal results are displayed) Labs Reviewed  URINALYSIS, ROUTINE W REFLEX MICROSCOPIC - Abnormal; Notable for the following:       Result Value   Color, Urine COLORLESS (*)    Specific Gravity, Urine 1.002 (*)    Hgb urine dipstick SMALL (*)    All other  components within normal limits     Radiology Dg Lumbar Spine Complete  Result Date: 06/15/2016 CLINICAL DATA:  Low back pain radiating down the leg for several months EXAM: LUMBAR SPINE - COMPLETE 4+ VIEW COMPARISON:  None. FINDINGS: Transitional anatomy is present with 4 non rib-bearing lumbar vertebra. First non rib-bearing lumbar vertebra is designated L1. Lumbar alignment within normal limits. Minimal anterior wedge deformity of T12. IMPRESSION: No acute osseous abnormality. Electronically Signed   By: Jasmine Pang M.D.   On: 06/15/2016 00:00    Procedures Procedures (including critical care time)  Medications Ordered in ED Medications  cyclobenzaprine (  FLEXERIL) tablet 10 mg (10 mg Oral Given 06/14/16 2308)     Initial Impression / Assessment and Plan / ED Course  Patient with back pain.  No neurological deficits and normal neuro exam.  Patient is ambulatory.  No loss of bowel or bladder control.  No concern for cauda equina.  No fever, night sweats, weight loss, h/o cancer, IVDA, no recent procedure to back. No urinary symptoms suggestive of UTI.  Supportive care and return precaution discussed. Appears safe for discharge at this time. Follow up as indicated in discharge paperwork.   I have reviewed the triage vital signs and the nursing notes.  Pertinent labs & imaging results that were available during my care of the patient were reviewed by me and considered in my medical decision making (see chart for details).  Clinical Course    Final Clinical Impressions(s) / ED Diagnoses   Final diagnoses:  Sciatica, left side    New Prescriptions Discharge Medication List as of 06/15/2016 12:22 AM    START taking these medications   Details  diclofenac (VOLTAREN) 50 MG EC tablet Take 1 tablet (50 mg total) by mouth 2 (two) times daily., Starting Thu 06/15/2016, Print    methocarbamol (ROBAXIN) 500 MG tablet Take 1 tablet (500 mg total) by mouth 2 (two) times daily., Starting Thu  06/15/2016, Print      I personally performed the services described in this documentation, which was scribed in my presence. The recorded information has been reviewed and is accurate.     [address]Hope MaysvilleM Kaelan Amble, NP 06/16/16 0109    Zadie Rhineonald Wickline, MD 06/16/16 [phone]

## 2016-06-15 LAB — URINALYSIS, ROUTINE W REFLEX MICROSCOPIC
Bacteria, UA: NONE SEEN
Bilirubin Urine: NEGATIVE
GLUCOSE, UA: NEGATIVE mg/dL
KETONES UR: NEGATIVE mg/dL
LEUKOCYTES UA: NEGATIVE
Nitrite: NEGATIVE
PH: 6 (ref 5.0–8.0)
Protein, ur: NEGATIVE mg/dL
RBC / HPF: NONE SEEN RBC/hpf (ref 0–5)
SQUAMOUS EPITHELIAL / LPF: NONE SEEN
Specific Gravity, Urine: 1.002 — ABNORMAL LOW (ref 1.005–1.030)
WBC, UA: NONE SEEN WBC/hpf (ref 0–5)

## 2016-06-15 MED ORDER — METHOCARBAMOL 500 MG PO TABS: 500.0000 mg | ORAL_TABLET | Freq: Two times a day (BID) | ORAL | 0 refills | Status: DC

## 2016-06-15 MED ORDER — DICLOFENAC SODIUM 50 MG PO TBEC: 50.0000 mg | DELAYED_RELEASE_TABLET | Freq: Two times a day (BID) | ORAL | 0 refills | Status: DC

## 2016-06-15 NOTE — ED Notes (Signed)
..  Video visit coupon no.[id number] [facility] and handout given to pt. on discharge .

## 2016-06-15 NOTE — Discharge Instructions (Signed)
Do not take the muscle relaxant while working or driving as it will make you sleepy.

## 2016-07-31 ENCOUNTER — Other Ambulatory Visit (INDEPENDENT_AMBULATORY_CARE_PROVIDER_SITE_OTHER): Payer: 59

## 2016-07-31 ENCOUNTER — Ambulatory Visit (INDEPENDENT_AMBULATORY_CARE_PROVIDER_SITE_OTHER): Payer: 59 | Admitting: Internal Medicine

## 2016-07-31 ENCOUNTER — Encounter: Payer: Self-pay | Admitting: Internal Medicine

## 2016-07-31 VITALS — BP 144/88 | HR 91 | Temp 98.4°F | Resp 16 | Ht 72.0 in | Wt 188.0 lb

## 2016-07-31 DIAGNOSIS — I10 Essential (primary) hypertension: Secondary | ICD-10-CM

## 2016-07-31 DIAGNOSIS — K760 Fatty (change of) liver, not elsewhere classified: Secondary | ICD-10-CM

## 2016-07-31 DIAGNOSIS — H01139 Eczematous dermatitis of unspecified eye, unspecified eyelid: Secondary | ICD-10-CM

## 2016-07-31 DIAGNOSIS — R739 Hyperglycemia, unspecified: Secondary | ICD-10-CM

## 2016-07-31 DIAGNOSIS — J301 Allergic rhinitis due to pollen: Secondary | ICD-10-CM | POA: Diagnosis not present

## 2016-07-31 LAB — COMPREHENSIVE METABOLIC PANEL
ALBUMIN: 4.5 g/dL (ref 3.5–5.2)
ALT: 54 U/L — ABNORMAL HIGH (ref 0–53)
AST: 61 U/L — ABNORMAL HIGH (ref 0–37)
Alkaline Phosphatase: 47 U/L (ref 39–117)
BUN: 11 mg/dL (ref 6–23)
CO2: 30 mEq/L (ref 19–32)
Calcium: 9.3 mg/dL (ref 8.4–10.5)
Chloride: 101 mEq/L (ref 96–112)
Creatinine, Ser: 0.79 mg/dL (ref 0.40–1.50)
GFR: 134.63 mL/min (ref >60.00)
Glucose, Bld: 98 mg/dL (ref 70–99)
POTASSIUM: 4.3 meq/L (ref 3.5–5.1)
SODIUM: 137 meq/L (ref 135–145)
Total Bilirubin: 0.9 mg/dL (ref 0.2–1.2)
Total Protein: 7.9 g/dL (ref 6.0–8.3)

## 2016-07-31 LAB — HEMOGLOBIN A1C: Hgb A1c MFr Bld: 4.7 % (ref 4.6–6.5)

## 2016-07-31 LAB — CBC WITH DIFFERENTIAL/PLATELET
BASOS PCT: 1 % (ref 0.0–3.0)
Basophils Absolute: 0.1 10*3/uL (ref 0.0–0.1)
EOS PCT: 6.5 % — AB (ref 0.0–5.0)
Eosinophils Absolute: 0.4 10*3/uL (ref 0.0–0.7)
HEMATOCRIT: 43.1 % (ref 39.0–52.0)
HEMOGLOBIN: 14.9 g/dL (ref 13.0–17.0)
LYMPHS PCT: 22.9 % (ref 12.0–46.0)
Lymphs Abs: 1.3 10*3/uL (ref 0.7–4.0)
MCHC: 34.7 g/dL (ref 30.0–36.0)
MCV: 101.1 fl — ABNORMAL HIGH (ref 78.0–100.0)
Monocytes Absolute: 0.6 10*3/uL (ref 0.1–1.0)
Monocytes Relative: 10.1 % (ref 3.0–12.0)
Neutro Abs: 3.4 10*3/uL (ref 1.4–7.7)
Neutrophils Relative %: 59.5 % (ref 43.0–77.0)
Platelets: 236 10*3/uL (ref 150.0–400.0)
RBC: 4.26 Mil/uL (ref 4.22–5.81)
RDW: 12.1 % (ref 11.5–15.5)
WBC: 5.7 10*3/uL (ref 4.0–10.5)

## 2016-07-31 MED ORDER — DESONIDE 0.05 % EX LOTN: TOPICAL_LOTION | Freq: Two times a day (BID) | CUTANEOUS | 3 refills | Status: DC

## 2016-07-31 MED ORDER — OLMESARTAN MEDOXOMIL-HCTZ 40-25 MG PO TABS
1.0000 | ORAL_TABLET | Freq: Every day | ORAL | 1 refills | Status: DC
Start: 2016-07-31 — End: 2017-07-19

## 2016-07-31 MED ORDER — BECLOMETHASONE DIPROPIONATE 80 MCG/ACT NA AERS: 4.0000 | INHALATION_SPRAY | Freq: Every day | NASAL | 11 refills | Status: DC

## 2016-07-31 MED ORDER — FEXOFENADINE HCL 180 MG PO TABS: 180.0000 mg | ORAL_TABLET | Freq: Every day | ORAL | 3 refills | Status: DC | PRN

## 2016-07-31 MED FILL — DESONIDE 0.05% LOTION: 0.05 | 20 days supply | Qty: 59 | Fill #0

## 2016-07-31 MED FILL — OLMESARTAN-HCTZ 40-25 MG TA: 40-25 | 90 days supply | Qty: 90 | Fill #0

## 2016-07-31 NOTE — Progress Notes (Addendum)
Subjective:  Patient ID: Thomas Berry, male    DOB: October 04, 1970  Age: 46 y.o. MRN: [id number]  CC: Rash and Hypertension   HPI Thomas Berry presents for a BP check - He tells me his blood pressure has been well controlled and he has had no recent episodes of chest pain, shortness of breath, palpitations, edema, or fatigue.  He complains of recurrence of nasal allergies and wants to restart antihistamines and a steroid nasal spray.  He also complains of a 6 month history of itchy rash on both of his eyelids.  Outpatient Medications Prior to Visit  Medication Sig Dispense Refill  . diclofenac (VOLTAREN) 50 MG EC tablet Take 1 tablet (50 mg total) by mouth 2 (two) times daily. 15 tablet 0  . methocarbamol (ROBAXIN) 500 MG tablet Take 1 tablet (500 mg total) by mouth 2 (two) times daily. 20 tablet 0  . Beclomethasone Dipropionate (QNASL) 80 MCG/ACT AERS Place 4 puffs into the nose daily. 8.7 g 11  . fexofenadine (ALLEGRA) 180 MG tablet Take 1 tablet (180 mg total) by mouth daily as needed (for allergies). 90 tablet 1  . olmesartan-hydrochlorothiazide (BENICAR HCT) 40-25 MG tablet Take 1 tablet by mouth daily. 90 tablet 1   No facility-administered medications prior to visit.     ROS Review of Systems  Constitutional: Negative.  Negative for chills, diaphoresis, fatigue and fever.  HENT: Positive for congestion, postnasal drip and rhinorrhea. Negative for facial swelling, sinus pain, sinus pressure, sneezing, sore throat, tinnitus and trouble swallowing.   Eyes: Negative.  Negative for visual disturbance.  Respiratory: Negative.  Negative for cough, choking, chest tightness and shortness of breath.   Cardiovascular: Negative for chest pain, palpitations and leg swelling.  Gastrointestinal: Negative for abdominal pain, constipation, diarrhea, nausea and vomiting.  Endocrine: Negative.   Genitourinary: Negative.   Musculoskeletal: Negative for back pain, myalgias and neck pain.    Skin: Positive for rash.  Allergic/Immunologic: Negative.   Neurological: Negative.  Negative for dizziness, speech difficulty, weakness, light-headedness and headaches.  Hematological: Negative.  Negative for adenopathy. Does not bruise/bleed easily.  Psychiatric/Behavioral: Negative.     Objective:  BP (!) 144/88 (BP Location: Left Arm, Patient Position: Sitting, Cuff Size: Normal)   Pulse 91   Temp 98.4 F (36.9 C) (Oral)   Resp 16   Ht 6' (1.829 m)   Wt 188 lb (85.3 kg)   SpO2 98%   BMI 25.50 kg/m   BP Readings from Last 3 Encounters:  07/31/16 (!) 144/88  06/15/16 134/79  09/27/15 (!) 166/98    Wt Readings from Last 3 Encounters:  07/31/16 188 lb (85.3 kg)  06/14/16 188 lb (85.3 kg)  09/27/15 187 lb (84.8 kg)    Physical Exam  Constitutional: He is oriented to person, place, and time. No distress.  HENT:  Nose: Mucosal edema and rhinorrhea present. No sinus tenderness or nasal septal hematoma. No epistaxis. Right sinus exhibits no maxillary sinus tenderness and no frontal sinus tenderness. Left sinus exhibits no maxillary sinus tenderness and no frontal sinus tenderness.  Mouth/Throat: Oropharynx is clear and moist and mucous membranes are normal. Mucous membranes are not pale, not dry and not cyanotic. No oropharyngeal exudate, posterior oropharyngeal edema, posterior oropharyngeal erythema or tonsillar abscesses.  Eyes: Conjunctivae are normal. Right eye exhibits no discharge. Left eye exhibits no discharge. No scleral icterus.  Neck: Normal range of motion. Neck supple. No JVD present. No tracheal deviation present. No thyromegaly present.  Cardiovascular: Normal  rate, regular rhythm, normal heart sounds and intact distal pulses.  Exam reveals no gallop and no friction rub.   No murmur heard. Pulmonary/Chest: Effort normal and breath sounds normal. No stridor. No respiratory distress. He has no wheezes. He has no rales. He exhibits no tenderness.  Abdominal: Soft.  Bowel sounds are normal. He exhibits no distension and no mass. There is no tenderness. There is no rebound and no guarding.  Musculoskeletal: Normal range of motion. He exhibits no edema, tenderness or deformity.  Lymphadenopathy:    He has no cervical adenopathy.  Neurological: He is oriented to person, place, and time.  Skin: Skin is warm and dry. Rash noted. He is not diaphoretic. No erythema. No pallor.  There is scaling over both eyelids  Vitals reviewed.   Lab Results  Component Value Date   WBC 5.7 07/31/2016   HGB 14.9 07/31/2016   HCT 43.1 07/31/2016   PLT 236.0 07/31/2016   GLUCOSE 98 07/31/2016   CHOL 215 (H) 09/27/2015   TRIG 104.0 09/27/2015   HDL 70.50 09/27/2015   LDLCALC 124 (H) 09/27/2015   ALT 54 (H) 07/31/2016   AST 61 (H) 07/31/2016   NA 137 07/31/2016   K 4.3 07/31/2016   CL 101 07/31/2016   CREATININE 0.79 07/31/2016   BUN 11 07/31/2016   CO2 30 07/31/2016   TSH 1.29 09/27/2015   PSA 0.65 09/27/2015   INR 1.1 (H) 10/15/2012   HGBA1C 4.7 07/31/2016    Dg Lumbar Spine Complete  Result Date: 06/15/2016 CLINICAL DATA:  Low back pain radiating down the leg for several months EXAM: LUMBAR SPINE - COMPLETE 4+ VIEW COMPARISON:  None. FINDINGS: Transitional anatomy is present with 4 non rib-bearing lumbar vertebra. First non rib-bearing lumbar vertebra is designated L1. Lumbar alignment within normal limits. Minimal anterior wedge deformity of T12. IMPRESSION: No acute osseous abnormality. Electronically Signed   By: Jasmine Pang M.D.   On: 06/15/2016 00:00    Assessment & Plan:   Redell was seen today for rash and hypertension.  Diagnoses and all orders for this visit:  Eyelid eczema, unspecified laterality -     desonide (DESOWEN) 0.05 % lotion; Apply topically 2 (two) times daily.  Chronic allergic rhinitis due to pollen, unspecified seasonality -     fexofenadine (ALLEGRA) 180 MG tablet; Take 1 tablet (180 mg total) by mouth daily as needed (for  allergies). -     Discontinue: Beclomethasone Dipropionate (QNASL) 80 MCG/ACT AERS; Place 4 puffs into the nose daily. -     fluticasone (FLONASE) 50 MCG/ACT nasal spray; Place 2 sprays into both nostrils daily.  Essential hypertension- his blood pressure is well-controlled, electrolytes and renal function are normal. -     olmesartan-hydrochlorothiazide (BENICAR HCT) 40-25 MG tablet; Take 1 tablet by mouth daily. -     Comprehensive metabolic panel; Future -     CBC with Differential/Platelet; Future  Hyperglycemia- improvement noted -     Hemoglobin A1c; Future  Chronic seasonal allergic rhinitis due to pollen -     fluticasone (FLONASE) 50 MCG/ACT nasal spray; Place 2 sprays into both nostrils daily.  Fatty liver disease, nonalcoholic- his liver enzymes remain mildly elevated, I've asked him to improve his lifestyle modifications.   I have discontinued Mr. Banghart Beclomethasone Dipropionate and Beclomethasone Dipropionate. I am also having him start on desonide and fluticasone. Additionally, I am having him maintain his methocarbamol, diclofenac, fexofenadine, and olmesartan-hydrochlorothiazide.  Meds ordered this encounter  Medications  . fexofenadine (  ALLEGRA) 180 MG tablet    Sig: Take 1 tablet (180 mg total) by mouth daily as needed (for allergies).    Dispense:  90 tablet    Refill:  3  . DISCONTD: Beclomethasone Dipropionate (QNASL) 80 MCG/ACT AERS    Sig: Place 4 puffs into the nose daily.    Dispense:  8.7 g    Refill:  11  . olmesartan-hydrochlorothiazide (BENICAR HCT) 40-25 MG tablet    Sig: Take 1 tablet by mouth daily.    Dispense:  90 tablet    Refill:  1  . desonide (DESOWEN) 0.05 % lotion    Sig: Apply topically 2 (two) times daily.    Dispense:  59 mL    Refill:  3  . fluticasone (FLONASE) 50 MCG/ACT nasal spray    Sig: Place 2 sprays into both nostrils daily.    Dispense:  48 g    Refill:  3     Follow-up: Return in about 3 months (around  10/28/2016).  Sanda Linger, MD

## 2016-07-31 NOTE — Progress Notes (Signed)
Pre visit review using our clinic review tool, if applicable. No additional management support is needed unless otherwise documented below in the visit note. 

## 2016-07-31 NOTE — Patient Instructions (Signed)
Hypertension Hypertension, commonly called high blood pressure, is when the force of blood pumping through your arteries is too strong. Your arteries are the blood vessels that carry blood from your heart throughout your body. A blood pressure reading consists of a higher number over a lower number, such as 110/72. The higher number (systolic) is the pressure inside your arteries when your heart pumps. The lower number (diastolic) is the pressure inside your arteries when your heart relaxes. Ideally you want your blood pressure below 120/80. Hypertension forces your heart to work harder to pump blood. Your arteries may become narrow or stiff. Having untreated or uncontrolled hypertension can cause heart attack, stroke, kidney disease, and other problems. What increases the risk? Some risk factors for high blood pressure are controllable. Others are not. Risk factors you cannot control include:  Race. You may be at higher risk if you are African American.  Age. Risk increases with age.  Gender. Men are at higher risk than women before age 45 years. After age 65, women are at higher risk than men. Risk factors you can control include:  Not getting enough exercise or physical activity.  Being overweight.  Getting too much fat, sugar, calories, or salt in your diet.  Drinking too much alcohol. What are the signs or symptoms? Hypertension does not usually cause signs or symptoms. Extremely high blood pressure (hypertensive crisis) may cause headache, anxiety, shortness of breath, and nosebleed. How is this diagnosed? To check if you have hypertension, your health care provider will measure your blood pressure while you are seated, with your arm held at the level of your heart. It should be measured at least twice using the same arm. Certain conditions can cause a difference in blood pressure between your right and left arms. A blood pressure reading that is higher than normal on one occasion does  not mean that you need treatment. If it is not clear whether you have high blood pressure, you may be asked to return on a different day to have your blood pressure checked again. Or, you may be asked to monitor your blood pressure at home for 1 or more weeks. How is this treated? Treating high blood pressure includes making lifestyle changes and possibly taking medicine. Living a healthy lifestyle can help lower high blood pressure. You may need to change some of your habits. Lifestyle changes may include:  Following the DASH diet. This diet is high in fruits, vegetables, and whole grains. It is low in salt, red meat, and added sugars.  Keep your sodium intake below 2,300 mg per day.  Getting at least 30-45 minutes of aerobic exercise at least 4 times per week.  Losing weight if necessary.  Not smoking.  Limiting alcoholic beverages.  Learning ways to reduce stress. Your health care provider may prescribe medicine if lifestyle changes are not enough to get your blood pressure under control, and if one of the following is true:  You are 18-59 years of age and your systolic blood pressure is above 140.  You are 60 years of age or older, and your systolic blood pressure is above 150.  Your diastolic blood pressure is above 90.  You have diabetes, and your systolic blood pressure is over 140 or your diastolic blood pressure is over 90.  You have kidney disease and your blood pressure is above 140/90.  You have heart disease and your blood pressure is above 140/90. Your personal target blood pressure may vary depending on your medical   conditions, your age, and other factors. Follow these instructions at home:  Have your blood pressure rechecked as directed by your health care provider.  Take medicines only as directed by your health care provider. Follow the directions carefully. Blood pressure medicines must be taken as prescribed. The medicine does not work as well when you skip  doses. Skipping doses also puts you at risk for problems.  Do not smoke.  Monitor your blood pressure at home as directed by your health care provider. Contact a health care provider if:  You think you are having a reaction to medicines taken.  You have recurrent headaches or feel dizzy.  You have swelling in your ankles.  You have trouble with your vision. Get help right away if:  You develop a severe headache or confusion.  You have unusual weakness, numbness, or feel faint.  You have severe chest or abdominal pain.  You vomit repeatedly.  You have trouble breathing. This information is not intended to replace advice given to you by your health care provider. Make sure you discuss any questions you have with your health care provider. Document Released: 05/29/2005 Document Revised: 11/04/2015 Document Reviewed: 03/21/2013 Elsevier Interactive Patient Education  2017 [employer]  

## 2016-08-01 MED ORDER — FLUTICASONE PROPIONATE 50 MCG/ACT NA SUSP: 2.0000 | Freq: Every day | NASAL | 3 refills | Status: DC

## 2016-10-04 ENCOUNTER — Emergency Department (HOSPITAL_COMMUNITY)
Admission: EM | Admit: 2016-10-04 | Discharge: 2016-10-04 | Disposition: A | Discharge status: Home / Self Care | Payer: 59 | Attending: Emergency Medicine | Admitting: Emergency Medicine

## 2016-10-04 DIAGNOSIS — M5432 Sciatica, left side: Secondary | ICD-10-CM | POA: Diagnosis not present

## 2016-10-04 DIAGNOSIS — I10 Essential (primary) hypertension: Secondary | ICD-10-CM | POA: Insufficient documentation

## 2016-10-04 DIAGNOSIS — J45909 Unspecified asthma, uncomplicated: Secondary | ICD-10-CM | POA: Diagnosis not present

## 2016-10-04 DIAGNOSIS — Z79899 Other long term (current) drug therapy: Secondary | ICD-10-CM | POA: Diagnosis not present

## 2016-10-04 DIAGNOSIS — M545 Low back pain: Secondary | ICD-10-CM | POA: Diagnosis present

## 2016-10-04 MED ORDER — PREDNISONE 20 MG PO TABS: ORAL_TABLET | ORAL | 0 refills | Status: DC

## 2016-10-04 MED ORDER — METHOCARBAMOL 500 MG PO TABS
500.0000 mg | ORAL_TABLET | Freq: Once | ORAL | Status: AC
  Administered 2016-10-04: 500 mg via ORAL
  Filled 2016-10-04: qty 1

## 2016-10-04 MED ORDER — METHOCARBAMOL 500 MG PO TABS: 500.0000 mg | ORAL_TABLET | Freq: Two times a day (BID) | ORAL | 0 refills | Status: DC

## 2016-10-04 MED ORDER — PREDNISONE 20 MG PO TABS
60.0000 mg | ORAL_TABLET | Freq: Once | ORAL | Status: AC
  Administered 2016-10-04: 60 mg via ORAL
  Filled 2016-10-04: qty 3

## 2016-10-04 MED FILL — METHOCARBAMOL 500 MG TABLET: 500 | 10 days supply | Qty: 20 | Fill #0

## 2016-10-04 MED FILL — predniSONE 20 MG TABS: 20 | 9 days supply | Qty: 12 | Fill #0

## 2016-10-04 NOTE — ED Notes (Signed)
Patient given water to take daily BP med.

## 2016-10-04 NOTE — ED Provider Notes (Signed)
[facility]-EMERGENCY DEPT Provider Note   CSN: [id number] Arrival date & time: 10/04/16  1191     History   Chief Complaint Chief Complaint  Patient presents with  . Back Pain  . Leg Pain    HPI Thomas Berry is a 46 y.o. male.  The history is provided by the patient and medical records.  Back Pain   Associated symptoms include leg pain.  Leg Pain      46 year old male with history of asthma, hypertension, prostatic hypertrophy, presenting to the ED for back pain. Patient reports he always has some degree of back pain, however this waxes and wanes in severity. States yesterday afternoon his back started bothering him a little worse, but he got up and tried to come to work this morning. States while he was walking inside from the parking lot he started having worsening pain so he came here for evaluation. Pain begins in his left lower back and radiates into the left posterior thigh but does not descend past the knee. He reports some tingling in his buttocks region. He denies any focal numbness or weakness of the legs. No bowel or bladder incontinence. States he was seen here for this previously and diagnosed with sciatica. He was prescribed muscle relaxers which helped a great deal, however he has run out of the medication. He has not had any new injury, trauma, or falls. No fever or chills. Denies any urinary symptoms.  Past Medical History:  Diagnosis Date  . Abnormal EKG    09/20/09 LVH in limb leads  . Asthma   . ED (erectile dysfunction)   . Glaucoma   . HTN (hypertension)   . Hypertrophy of prostate with urinary obstruction and other lower urinary tract symptoms (LUTS)   . Other and unspecified alcohol dependence, episodic drinking behavior   . Routine general medical examination at a health care facility     Patient Active Problem List   Diagnosis Date Noted  . Fatty liver disease, nonalcoholic 10/15/2012  . Routine general medical examination at a health care facility  07/01/2012  . Eyelid eczema, unspecified laterality 07/01/2012  . Hyperglycemia 05/24/2011  . HTN (hypertension) 08/31/2010  . Allergic rhinitis 08/31/2010  . HYPERTROPHY PROSTATE W/UR OBST & OTH LUTS 07/08/2009  . Asthma 04/19/2009    No past surgical history on file.     Home Medications    Prior to Admission medications   Medication Sig Start Date End Date Taking? Authorizing Provider  desonide (DESOWEN) 0.05 % lotion Apply topically 2 (two) times daily. 07/31/16   Etta Grandchild, MD  diclofenac (VOLTAREN) 50 MG EC tablet Take 1 tablet (50 mg total) by mouth 2 (two) times daily. 06/15/16   Hope Orlene Och, NP  fexofenadine (ALLEGRA) 180 MG tablet Take 1 tablet (180 mg total) by mouth daily as needed (for allergies). 07/31/16   Etta Grandchild, MD  fluticasone (FLONASE) 50 MCG/ACT nasal spray Place 2 sprays into both nostrils daily. 08/01/16   Etta Grandchild, MD  methocarbamol (ROBAXIN) 500 MG tablet Take 1 tablet (500 mg total) by mouth 2 (two) times daily. 06/15/16   Hope Orlene Och, NP  olmesartan-hydrochlorothiazide (BENICAR HCT) 40-25 MG tablet Take 1 tablet by mouth daily. 07/31/16   Etta Grandchild, MD    Family History Family History  Problem Relation Age of Onset  . Hypertension Other   . Stroke Other   . Cancer Neg Hx   . Alcohol abuse Neg Hx   .  Diabetes Neg Hx   . Early death Neg Hx   . Heart disease Neg Hx   . Hyperlipidemia Neg Hx   . Kidney disease Neg Hx     Social History Social History  Substance Use Topics  . Smoking status: Never Smoker  . Smokeless tobacco: Never Used  . Alcohol use 1.8 oz/week    3 Cans of beer per week     Allergies   Ace inhibitors   Review of Systems Review of Systems  Musculoskeletal: Positive for back pain.  All other systems reviewed and are negative.    Physical Exam Updated Vital Signs BP (!) 147/93   Pulse 80   Temp 98.3 F (36.8 C) (Oral)   Resp 19   SpO2 97%   Physical Exam  Constitutional: He is oriented to  person, place, and time. He appears well-developed and well-nourished.  HENT:  Head: Normocephalic and atraumatic.  Mouth/Throat: Oropharynx is clear and moist.  Eyes: Conjunctivae and EOM are normal. Pupils are equal, round, and reactive to light.  Neck: Normal range of motion.  Cardiovascular: Normal rate, regular rhythm and normal heart sounds.   Pulmonary/Chest: Effort normal and breath sounds normal. No respiratory distress. He has no wheezes.  Abdominal: Soft. Bowel sounds are normal.  Musculoskeletal: Normal range of motion.  Tenderness of left SI joint without deformity; full ROM of lumbar spine; + SLR on left; normal strength and sensation of both legs, normal gait  Neurological: He is alert and oriented to person, place, and time.  Skin: Skin is warm and dry.  Psychiatric: He has a normal mood and affect.  Nursing note and vitals reviewed.    ED Treatments / Results  Labs (all labs ordered are listed, but only abnormal results are displayed) Labs Reviewed - No data to display  EKG  EKG Interpretation None       Radiology No results found.  Procedures Procedures (including critical care time)  Medications Ordered in ED Medications  predniSONE (DELTASONE) tablet 60 mg (60 mg Oral Given 10/04/16 0811)  methocarbamol (ROBAXIN) tablet 500 mg (500 mg Oral Given 10/04/16 1610)     Initial Impression / Assessment and Plan / ED Course  I have reviewed the triage vital signs and the nursing notes.  Pertinent labs & imaging results that were available during my care of the patient were reviewed by me and considered in my medical decision making (see chart for details).  46 year old male here with left-sided back pain. Reports history of similar past. He is afebrile and nontoxic. Does have some tenderness of the left SI joint on exam and a positive straight leg raise on left. He has no focal numbness or weakness of his legs. No bowel or bladder incontinence. He remains  ambulatory with steady gait. No signs or symptoms concerning for cauda equina. No other red flag symptoms. Suspect recurrence of his sciatica. He had good relief with muscle relaxers in the past so we'll treat with this as well as prednisone taper. Recommended close follow-up with PCP. Work note provided.  Discussed plan with patient, he acknowledged understanding and agreed with plan of care.  Return precautions given for new or worsening symptoms.  Final Clinical Impressions(s) / ED Diagnoses   Final diagnoses:  Sciatica of left side    New Prescriptions Discharge Medication List as of 10/04/2016  8:53 AM    START taking these medications   Details  predniSONE (DELTASONE) 20 MG tablet Take 40 mg by mouth daily  for 3 days, then  by mouth daily for 3 days, then  daily for 3 days, Print         Garlon Hatchet, PA-C 10/04/16 0919    Shaune Pollack, MD 10/04/16 [phone]

## 2016-10-04 NOTE — Discharge Instructions (Signed)
Take the prescribed medication as directed. °Follow-up with your primary care doctor. °Return to the ED for new or worsening symptoms. °

## 2016-10-04 NOTE — ED Triage Notes (Signed)
Patient comes in with c/o shooting, sharp, tingling pain from lower back down left leg. Patient denies injury. States this started 2 yrs ago and the pain just got too bad yesterday. States the MD years ago told him this was sciatica.

## 2016-11-20 ENCOUNTER — Encounter: Payer: Self-pay | Admitting: Internal Medicine

## 2016-11-20 ENCOUNTER — Ambulatory Visit (INDEPENDENT_AMBULATORY_CARE_PROVIDER_SITE_OTHER): Payer: 59 | Admitting: Internal Medicine

## 2016-11-20 VITALS — BP 146/92 | HR 85 | Temp 98.3°F | Resp 12 | Ht 69.0 in | Wt 183.0 lb

## 2016-11-20 DIAGNOSIS — M545 Low back pain, unspecified: Secondary | ICD-10-CM

## 2016-11-20 DIAGNOSIS — I10 Essential (primary) hypertension: Secondary | ICD-10-CM | POA: Diagnosis not present

## 2016-11-20 DIAGNOSIS — M79605 Pain in left leg: Secondary | ICD-10-CM

## 2016-11-20 DIAGNOSIS — R937 Abnormal findings on diagnostic imaging of other parts of musculoskeletal system: Secondary | ICD-10-CM | POA: Diagnosis not present

## 2016-11-20 DIAGNOSIS — M48061 Spinal stenosis, lumbar region without neurogenic claudication: Secondary | ICD-10-CM | POA: Insufficient documentation

## 2016-11-20 NOTE — Progress Notes (Signed)
Subjective:  Patient ID: Thomas Berry, male    DOB: 1970/09/29  Age: 46 y.o. MRN: [id number]  CC: Hypertension and Back Pain   HPI Thomas Hoithomas H Stump presents for recurrent episodes of left lower back pain. This has been a recurrent problem for him for several years. He was seen in the ED about 6 months ago and several medications were prescribed but he never got the prescriptions filled. The pain is exacerbated with activity and sometimes it causes him to miss work. He's had no recent episodes of numbness, weakness, or tingling in his lower extremities.  Outpatient Medications Prior to Visit  Medication Sig Dispense Refill  . desonide (DESOWEN) 0.05 % lotion Apply topically 2 (two) times daily. 59 mL 3  . fexofenadine (ALLEGRA) 180 MG tablet Take 1 tablet (180 mg total) by mouth daily as needed (for allergies). 90 tablet 3  . fluticasone (FLONASE) 50 MCG/ACT nasal spray Place 2 sprays into both nostrils daily. 48 g 3  . olmesartan-hydrochlorothiazide (BENICAR HCT) 40-25 MG tablet Take 1 tablet by mouth daily. 90 tablet 1  . diclofenac (VOLTAREN) 50 MG EC tablet Take 1 tablet (50 mg total) by mouth 2 (two) times daily. 15 tablet 0  . methocarbamol (ROBAXIN) 500 MG tablet Take 1 tablet (500 mg total) by mouth 2 (two) times daily. 20 tablet 0  . predniSONE (DELTASONE) 20 MG tablet Take 40 mg by mouth daily for 3 days, then 20mg  by mouth daily for 3 days, then 10mg  daily for 3 days 12 tablet 0   No facility-administered medications prior to visit.     ROS Review of Systems  Constitutional: Negative.  Negative for appetite change, diaphoresis, fatigue, fever and unexpected weight change.  HENT: Negative.   Eyes: Negative.  Negative for visual disturbance.  Respiratory: Negative for apnea, cough, choking, chest tightness, shortness of breath, wheezing and stridor.   Cardiovascular: Negative for chest pain, palpitations and leg swelling.  Gastrointestinal: Negative for abdominal pain,  constipation, diarrhea, nausea and vomiting.  Endocrine: Negative.   Genitourinary: Negative.  Negative for difficulty urinating, dysuria, frequency and urgency.  Musculoskeletal: Positive for back pain. Negative for arthralgias, joint swelling, myalgias and neck pain.  Skin: Negative.  Negative for color change and pallor.  Neurological: Negative.  Negative for dizziness, weakness, light-headedness, numbness and headaches.  Hematological: Negative for adenopathy. Does not bruise/bleed easily.  Psychiatric/Behavioral: Negative.     Objective:  BP (!) 146/92 (BP Location: Left Arm, Patient Position: Sitting, Cuff Size: Normal)   Pulse 85   Temp 98.3 F (36.8 C) (Oral)   Resp 12   Ht 5\' 9"  (1.753 m)   Wt 183 lb (83 kg)   SpO2 98%   BMI 27.02 kg/m   BP Readings from Last 3 Encounters:  11/20/16 (!) 146/92  10/04/16 (!) 143/101  07/31/16 (!) 144/88    Wt Readings from Last 3 Encounters:  11/20/16 183 lb (83 kg)  10/04/16 180 lb (81.6 kg)  07/31/16 188 lb (85.3 kg)    Physical Exam  Constitutional: He is oriented to person, place, and time. No distress.  HENT:  Mouth/Throat: Oropharynx is clear and moist. No oropharyngeal exudate.  Eyes: Conjunctivae are normal. Right eye exhibits no discharge. Left eye exhibits no discharge. No scleral icterus.  Neck: Normal range of motion. Neck supple. No JVD present. No thyromegaly present.  Cardiovascular: Normal rate, regular rhythm, normal heart sounds and intact distal pulses.  Exam reveals no gallop.   No murmur heard.  Pulmonary/Chest: Effort normal and breath sounds normal. No respiratory distress. He has no wheezes. He has no rales. He exhibits no tenderness.  Abdominal: Soft. Bowel sounds are normal. He exhibits no distension and no mass. There is no tenderness. There is no guarding.  Musculoskeletal: Normal range of motion. He exhibits no edema, tenderness or deformity.  Lymphadenopathy:    He has no cervical adenopathy.    Neurological: He is alert and oriented to person, place, and time. He displays normal reflexes. No cranial nerve deficit. He exhibits normal muscle tone. Coordination normal. He displays no Babinski's sign on the right side. He displays no Babinski's sign on the left side.  Reflex Scores:      Tricep reflexes are 1+ on the right side and 1+ on the left side.      Bicep reflexes are 1+ on the right side and 1+ on the left side.      Brachioradialis reflexes are 1+ on the right side and 1+ on the left side.      Patellar reflexes are 1+ on the right side and 1+ on the left side.      Achilles reflexes are 1+ on the right side and 1+ on the left side. NEG SLR in BLE  Skin: Skin is warm and dry. No rash noted. He is not diaphoretic. No erythema. No pallor.  Vitals reviewed.     No results found.  Assessment & Plan:   Simuel was seen today for hypertension and back pain.  Diagnoses and all orders for this visit:  Essential hypertension- His blood pressures adequately well-controlled  Low back pain radiating to left leg- his recent plain film showed a wedge deformity in the anterior aspect of T12, today he complains of pain radiating to left lower extremity but there is no evidence of radiculopathy on exam. Will start PT for symptom relief since he doesn't want to take medications. -     Ambulatory referral to Physical Therapy -     MR LUMBAR SPINE WO CONTRAST; Future  Abnormal x-ray of lumbar spine- I've ordered an MRI to see if he has developed spinal stenosis or nerve impingement, to see if the wedge deformity is a prior vertebral fracture or a suspicious tumor. -     MR LUMBAR SPINE WO CONTRAST; Future   I have discontinued Mr. Krontz diclofenac, methocarbamol, and predniSONE. I am also having him maintain his fexofenadine, olmesartan-hydrochlorothiazide, desonide, and fluticasone.  No orders of the defined types were placed in this encounter.    Follow-up: Return in about 6  weeks (around 01/01/2017).  Sanda Linger, MD

## 2016-11-20 NOTE — Patient Instructions (Signed)

## 2016-11-21 DIAGNOSIS — R937 Abnormal findings on diagnostic imaging of other parts of musculoskeletal system: Secondary | ICD-10-CM | POA: Insufficient documentation

## 2016-11-23 DIAGNOSIS — Z0279 Encounter for issue of other medical certificate: Secondary | ICD-10-CM

## 2016-11-23 MED FILL — OLMESARTAN-HCTZ 40-25 MG TA: 40-25 | 90 days supply | Qty: 90 | Fill #1

## 2016-11-29 ENCOUNTER — Encounter: Payer: Self-pay | Admitting: Physical Therapy

## 2016-11-29 ENCOUNTER — Ambulatory Visit: Payer: 59 | Attending: Internal Medicine | Admitting: Physical Therapy

## 2016-11-29 DIAGNOSIS — G8929 Other chronic pain: Secondary | ICD-10-CM | POA: Diagnosis not present

## 2016-11-29 DIAGNOSIS — M5440 Lumbago with sciatica, unspecified side: Secondary | ICD-10-CM | POA: Insufficient documentation

## 2016-11-29 DIAGNOSIS — M6281 Muscle weakness (generalized): Secondary | ICD-10-CM | POA: Insufficient documentation

## 2016-11-29 DIAGNOSIS — R293 Abnormal posture: Secondary | ICD-10-CM | POA: Diagnosis not present

## 2016-11-29 DIAGNOSIS — M5416 Radiculopathy, lumbar region: Secondary | ICD-10-CM | POA: Insufficient documentation

## 2016-11-29 NOTE — Patient Instructions (Signed)
From cabinet, gave childs pose stretch and sidelying trunk stretch to decompress Lt Lumbar and hip

## 2016-11-30 NOTE — Therapy (Signed)
[facility] [address] [place], [place], [address] Phone: [phone]   Fax:  [phone]  Physical Therapy Evaluation  Patient Details  Name: Thomas Berry MRN: [id number] Date of Birth: Apr 06, 1971 Referring Provider: Dr. Sanda Linger   Encounter Date: 11/29/2016      PT End of Session - 11/29/16 1537    Visit Number 1   Number of Visits 16   PT Start Time 1505   PT Stop Time 1550   PT Time Calculation (min) 45 min   Activity Tolerance Patient tolerated treatment well   Behavior During Therapy [facility] for tasks assessed/performed      Past Medical History:  Diagnosis Date  . Abnormal EKG    09/20/09 LVH in limb leads  . Asthma   . ED (erectile dysfunction)   . Glaucoma   . HTN (hypertension)   . Hypertrophy of prostate with urinary obstruction and other lower urinary tract symptoms (LUTS)   . Other and unspecified alcohol dependence, episodic drinking behavior   . Routine general medical examination at a health care facility     History reviewed. No pertinent surgical history.  There were no vitals filed for this visit.       Subjective Assessment - 11/29/16 1512    Subjective Patient with ongoing chronic low back pain off and on for over a year. Unsure of what may have caused it. He has pain in LLE to his knee, he has tingling and at times it feels weak. He walks with a limp, sits with a slumped posture to relieve pain.  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  He says that he does not necessarily have weaknesswith walking but has had several episodes inability ot lift leg into the car or hardly being able to walk.  He sometimes misses work and he has difficulty doing any kind of recreational activity after work with his kids due to severe pain.    Pertinent History HTN   Limitations Standing;Walking;House hold activities;Other (comment);Lifting  stairs, after work    How long can you stand  comfortably? only "comfortable" if he is busy, he stands 8 hours per day   How long can you walk comfortably? does not "go for a walk" but does not think he could >30 min    Diagnostic tests MRI ordered , XR done in ED his recent plain film showed a wedge deformity in the anterior aspect of T12, will confirm via MRI    Patient Stated Goals Pain relief    Currently in Pain? Yes   Pain Score 4    Pain Location Back   Pain Orientation Left   Pain Descriptors / Indicators Tingling;Aching;Sharp   Pain Type Chronic pain   Pain Radiating Towards ant/lat/post thigh stops at knee.    Pain Onset More than a month ago   Pain Frequency Intermittent   Aggravating Factors  standing, lifting    Pain Relieving Factors sitting, lying on Rt. side, OTC meds (min help) , ice    Effect of Pain on Daily Activities hard to enjoy life, can;t sleep on stomach, go up stairs             [facility] PT Assessment - 11/29/16 0001      Assessment   Medical Diagnosis lumbar pain    Referring Provider Dr. Sanda Linger    Onset Date/Surgical Date --  chronic    Next MD Visit unknown  Prior Therapy No     Precautions   Precautions None     Restrictions   Weight Bearing Restrictions No     Balance Screen   Has the patient fallen in the past 6 months No     Home Environment   Living Environment Private residence   Living Arrangements Spouse/significant other;Children   Type of Home House   Home Access Stairs to enter   Home Layout Two level     Prior Function   Level of Independence Independent   Vocation Full time employment   Vocation Requirements standing, lifting   Leisure none lately due to pain      Cognition   Overall Cognitive Status Within Functional Limits for tasks assessed     Observation/Other Assessments   Focus on Therapeutic Outcomes (FOTO)  NT     Sensation   Light Touch Appears Intact   Additional Comments during exam frequently mentioned tingling in LLE especially in prone       Posture/Postural Control   Posture/Postural Control Postural limitations   Postural Limitations Rounded Shoulders;Forward head;Increased thoracic kyphosis;Flexed trunk;Weight shift right     AROM   Lumbar Flexion 50%  pain    Lumbar Extension 50%  leg pain      PROM   Overall PROM Comments hip flexion felt good, WFL      Strength   Right/Left Hip --  fatigue demonstrated (poor endurance) with MMT    Right Hip Flexion 4/5   Right Hip Extension 3/5  back pain    Left Hip Flexion 4-/5   Left Hip Extension 3/5   Right Knee Flexion 5/5   Right Knee Extension 5/5   Left Knee Flexion 4+/5   Left Knee Extension 4+/5   Right Ankle Dorsiflexion 5/5   Left Ankle Dorsiflexion 4+/5     Flexibility   Hamstrings tight, not measured (less than 50 deg)      Palpation   Spinal mobility painful L3-L4-L5   Palpation comment TTP along paraspinals and into L hip, QL not tender      Special Tests   Lumbar Tests Slump Test     Slump test   Findings Positive   Side Left     Straight Leg Raise   Findings Negative   Comment pain in L ant/lateral thigh at 30-40 deg no pain in lumbar      Bed Mobility   Bed Mobility --  needs cues for mechanics to avoid pain             Objective measurements completed on examination: See above findings.          [facility] Adult PT Treatment/Exercise - 11/29/16 0001      Lumbar Exercises: Stretches   Prone on Elbows Stretch 2 reps   Prone on Elbows Stretch Limitations increased leg pain      Lumbar Exercises: Quadruped   Other Quadruped Lumbar Exercises childs pose FW and lateral x 30 sec x 2 each    Other Quadruped Lumbar Exercises sidelying QL/trunk      Manual Therapy   Manual Therapy Manual Traction   Manual therapy comments overpressure to L iliac crest   Manual Traction long axis distraction LLE                 PT Education - 11/29/16 2013    Education provided Yes   Education Details PT/POC, HEP, nerve pain,  weakness , red flags    Person(s) Educated Patient  Methods Explanation;Handout;Demonstration;Tactile cues;Verbal cues   Comprehension Verbalized understanding;Need further instruction          PT Short Term Goals - 11/30/16 4098      PT SHORT TERM GOAL #1   Title Pt will be I with HEP for trunk flexibility and posture   Time 4   Period Weeks   Status New     PT SHORT TERM GOAL #2   Title Pt will be able to report 25% less pain in LLE (centralizing) with normal daily activities.    Time 4   Period Weeks   Status New     PT SHORT TERM GOAL #3   Title Pt will be able to sit and stand with corrected posture to optimize spinal alignment.    Time 4   Period Weeks   Status New           PT Long Term Goals - 11/30/16 1191      PT LONG TERM GOAL #1   Title Pt will be I with concepts of body mechanics, posture and lifting for home and work tasks to prevent reinjury.    Time 8   Period Weeks   Status New     PT LONG TERM GOAL #2   Title Pt will be able to work with less pain in back, leg, reporting most days of the week <3/10 afterwards.     Time 8   Period Weeks   Status New     PT LONG TERM GOAL #3   Title Pt will be able to have no physical limitation with playing with kids, recreation.    Time 8   Period Weeks   Status New     PT LONG TERM GOAL #4   Title Pt will have normal strength in bilateral LE's (4+/5 or more)    Time 8   Period Weeks   Status New                Plan - 11/29/16 2016    Clinical Impression Statement Pt presents with evolving condition involving lumbar spine.  His symptoms have progressed and are now worsening, involving mild strength deficits and sensory changes.  Today his symptoms were radicular in nature and aggravated by spinal extension.  He did walk with better symmetry and less pain upon leaving the clinic.     History and Personal Factors relevant to plan of care: HTN   Clinical Presentation Evolving   Clinical  Presentation due to: new weakness, sensory sx, severity of pain    Clinical Decision Making Low   Rehab Potential Excellent   PT Frequency 2x / week   PT Duration 8 weeks   PT Treatment/Interventions ADLs/Self Care Home Management;Cryotherapy;Electrical Stimulation;Moist Heat;Traction;Ultrasound;Neuromuscular re-education;Passive range of motion;Therapeutic exercise;Balance training;Therapeutic activities;Manual techniques;Functional mobility training;Gait training;Patient/family education;Dry needling   PT Next Visit Plan check stretching HEP, manual/traction, modalities for pain, posture!    PT Home Exercise Plan side lying trunk and childs pose    Consulted and Agree with Plan of Care Patient      Patient will benefit from skilled therapeutic intervention in order to improve the following deficits and impairments:  Abnormal gait, Decreased endurance, Hypomobility, Impaired sensation, Decreased activity tolerance, Decreased strength, Increased fascial restricitons, Pain, Difficulty walking, Decreased mobility, Decreased range of motion, Improper body mechanics, Postural dysfunction, Impaired flexibility  Visit Diagnosis: Chronic left-sided low back pain with sciatica, sciatica laterality unspecified  Radiculopathy, lumbar region  Muscle weakness (generalized)  Abnormal posture  Problem List Patient Active Problem List   Diagnosis Date Noted  . Abnormal x-ray of lumbar spine 11/21/2016  . Low back pain radiating to left leg 11/20/2016  . Fatty liver disease, nonalcoholic 10/15/2012  . Routine general medical examination at a health care facility 07/01/2012  . Eyelid eczema, unspecified laterality 07/01/2012  . Hyperglycemia 05/24/2011  . HTN (hypertension) 08/31/2010  . Allergic rhinitis 08/31/2010  . HYPERTROPHY PROSTATE W/UR OBST & OTH LUTS 07/08/2009  . Asthma 04/19/2009    Lena Fieldhouse 11/30/2016, 6:34 AM  [facility] [address] [place], [place], [address] Phone: [phone]   Fax:  [phone]  Name: Thomas Berry MRN: [id number] Date of Birth: [birth year]   Karie Mainland, PT 11/30/16 6:38 AM Phone: [phone] Fax: [phone]

## 2016-12-06 ENCOUNTER — Encounter: Payer: Self-pay | Admitting: Physical Therapy

## 2016-12-06 ENCOUNTER — Ambulatory Visit: Payer: 59 | Admitting: Physical Therapy

## 2016-12-06 DIAGNOSIS — M6281 Muscle weakness (generalized): Secondary | ICD-10-CM | POA: Diagnosis not present

## 2016-12-06 DIAGNOSIS — R293 Abnormal posture: Secondary | ICD-10-CM

## 2016-12-06 DIAGNOSIS — M5416 Radiculopathy, lumbar region: Secondary | ICD-10-CM | POA: Diagnosis not present

## 2016-12-06 DIAGNOSIS — G8929 Other chronic pain: Secondary | ICD-10-CM

## 2016-12-06 DIAGNOSIS — M5440 Lumbago with sciatica, unspecified side: Principal | ICD-10-CM

## 2016-12-06 NOTE — Therapy (Addendum)
[facility] [address] [place], [place], [address] Phone: [phone]   Fax:  [phone]  Physical Therapy Treatment Addended Discharge   Patient Details  Name: Thomas Berry MRN: [id number] Date of Birth: 10/11/1970 Referring Provider: Dr. Keyen Jones   Encounter Date: 12/06/2016      PT End of Session - 12/06/16 1736    Visit Number 2   Number of Visits 16   PT Start Time 1547   PT Stop Time 1645   PT Time Calculation (min) 58 min   Activity Tolerance Patient tolerated treatment well   Behavior During Therapy WFL for tasks assessed/performed      Past Medical History:  Diagnosis Date  . Abnormal EKG    09/20/09 LVH in limb leads  . Asthma   . ED (erectile dysfunction)   . Glaucoma   . HTN (hypertension)   . Hypertrophy of prostate with urinary obstruction and other lower urinary tract symptoms (LUTS)   . Other and unspecified alcohol dependence, episodic drinking behavior   . Routine general medical examination at a health care facility     History reviewed. No pertinent surgical history.  There were no vitals filed for this visit.      Subjective Assessment - 12/06/16 1603    Subjective Pain was 7/10.  This morning when he woke up.  Pain comes and goes.   Currently in Pain? Yes   Pain Score 3   up to 7/10   Pain Location Back   Pain Orientation Left   Pain Descriptors / Indicators Tingling;Aching;Sharp   Pain Type Chronic pain   Pain Frequency Intermittent   Aggravating Factors  standing from car,,  standing,  lifting   Pain Relieving Factors sitting, lying on rt side.    Effect of Pain on Daily Activities Hard to enjoy life,                           [facility] Adult PT Treatment/Exercise - 12/06/16 0001      Lumbar Exercises: Stretches   Quadruped Mid Back Stretch Limitations child's pose     Lumbar Exercises: Supine   Clam 10 reps   Clam Limitations red band initially, too  hard so removed for AROM with abdominal bracing.   Bridge 10 reps   Straight Leg Raise 10 reps  left only   Isometric Hip Flexion 5 reps   Isometric Hip Flexion Limitations 2 sets Tingling noted with patienbt doing,  No tingling with PTA holding and resistingbflexion     Knee/Hip Exercises: Stretches   Passive Hamstring Stretch 3 reps;30 seconds  Left only   Quad Stretch 3 reps;30 seconds  left only over edge of mat.   Other Knee/Hip Stretches Quadratus lumborum on side with pillow at waist.  1 rep 60 seconds with some rib lift   Other Knee/Hip Stretches child's pose 3 reps 20 seconds, cues needed     Moist Heat Therapy   Number Minutes Moist Heat 15 Minutes   Moist Heat Location Lumbar Spine                PT Education - 12/06/16 1735    Education provided Yes   Education Details HEP   Person(s) Educated Patient   Methods Explanation;Verbal cues;Handout;Demonstration   Comprehension Verbalized understanding          PT Short Term Goals - 11/30/16 0623      PT SHORT TERM   GOAL #1   Title Pt will be I with HEP for trunk flexibility and posture   Time 4   Period Weeks   Status New     PT SHORT TERM GOAL #2   Title Pt will be able to report 25% less pain in LLE (centralizing) with normal daily activities.    Time 4   Period Weeks   Status New     PT SHORT TERM GOAL #3   Title Pt will be able to sit and stand with corrected posture to optimize spinal alignment.    Time 4   Period Weeks   Status New           PT Long Term Goals - 11/30/16 9892      PT LONG TERM GOAL #1   Title Pt will be I with concepts of body mechanics, posture and lifting for home and work tasks to prevent reinjury.    Time 8   Period Weeks   Status New     PT LONG TERM GOAL #2   Title Pt will be able to work with less pain in back, leg, reporting most days of the week <3/10 afterwards.     Time 8   Period Weeks   Status New     PT LONG TERM GOAL #3   Title Pt will be able  to have no physical limitation with playing with kids, recreation.    Time 8   Period Weeks   Status New     PT LONG TERM GOAL #4   Title Pt will have normal strength in bilateral LE's (4+/5 or more)    Time 8   Period Weeks   Status New               Plan - 12/06/16 1740    Clinical Impression Statement Patient presents with posteriorly rotated inominate today.  With stretching and muscle energy he was able to reduce pain to 2/10.   PT Next Visit Plan Check SI  muscle energy as needed,  continue Hamstring stretch (for HEP)     PT Home Exercise Plan side lying trunk and childs pose ,  SLR left   Consulted and Agree with Plan of Care Patient      Patient will benefit from skilled therapeutic intervention in order to improve the following deficits and impairments:  Abnormal gait, Decreased endurance, Hypomobility, Impaired sensation, Decreased activity tolerance, Decreased strength, Increased fascial restricitons, Pain, Difficulty walking, Decreased mobility, Decreased range of motion, Improper body mechanics, Postural dysfunction, Impaired flexibility  Visit Diagnosis: Chronic left-sided low back pain with sciatica, sciatica laterality unspecified  Radiculopathy, lumbar region  Muscle weakness (generalized)  Abnormal posture     Problem List Patient Active Problem List   Diagnosis Date Noted  . Abnormal x-ray of lumbar spine 11/21/2016  . Low back pain radiating to left leg 11/20/2016  . Fatty liver disease, nonalcoholic 11/94/1740  . Routine general medical examination at a health care facility 07/01/2012  . Eyelid eczema, unspecified laterality 07/01/2012  . Hyperglycemia 05/24/2011  . HTN (hypertension) 08/31/2010  . Allergic rhinitis 08/31/2010  . HYPERTROPHY PROSTATE W/UR OBST & OTH LUTS 07/08/2009  . Asthma 04/19/2009    Luvern Mischke PTA 12/06/2016, 5:46 PM  [facility] [address] [place],  [place], [address] Phone: [phone]   Fax:  [phone]  Name: Thomas Berry MRN: [id number] Date of Birth: Mar 05, 1971  PHYSICAL THERAPY DISCHARGE SUMMARY  Visits from [facility]  [facility] Care: 2  Current functional level related to goals / functional outcomes: See above for most recent    Remaining deficits: Unknown   Education / Equipment: HEP, SIJ and rotation  Plan: Patient agrees to discharge.  Patient goals were not met. Patient is being discharged due to not returning since the last visit.  ?????    Raeford Razor, PT 04/18/17 10:14 AM Phone: [phone] Fax: [phone]

## 2016-12-06 NOTE — Patient Instructions (Addendum)
HIP: Flexion / KNEE: Extension, Straight Leg Raise    Raise leg, keeping knee straight. Perform slowly. __10-20_ reps per set, __1_ sets per day, _7__ days per week   Copyright  VHI. All rights reserved.

## 2016-12-09 ENCOUNTER — Ambulatory Visit
Admission: RE | Admit: 2016-12-09 | Discharge: 2016-12-09 | Disposition: A | Discharge status: Home / Self Care | Payer: 59 | Source: Ambulatory Visit | Attending: Internal Medicine | Admitting: Internal Medicine

## 2016-12-09 DIAGNOSIS — R937 Abnormal findings on diagnostic imaging of other parts of musculoskeletal system: Secondary | ICD-10-CM

## 2016-12-09 DIAGNOSIS — M79605 Pain in left leg: Secondary | ICD-10-CM

## 2016-12-09 DIAGNOSIS — M545 Low back pain: Secondary | ICD-10-CM

## 2016-12-09 DIAGNOSIS — M5126 Other intervertebral disc displacement, lumbar region: Secondary | ICD-10-CM | POA: Diagnosis not present

## 2016-12-10 ENCOUNTER — Other Ambulatory Visit: Payer: Self-pay | Admitting: Internal Medicine

## 2016-12-10 DIAGNOSIS — M48061 Spinal stenosis, lumbar region without neurogenic claudication: Secondary | ICD-10-CM

## 2016-12-18 ENCOUNTER — Encounter: Payer: Self-pay | Admitting: Internal Medicine

## 2016-12-18 ENCOUNTER — Ambulatory Visit (INDEPENDENT_AMBULATORY_CARE_PROVIDER_SITE_OTHER): Payer: 59 | Admitting: Internal Medicine

## 2016-12-18 VITALS — BP 160/100 | HR 104 | Temp 99.5°F | Resp 16 | Ht 69.0 in | Wt 182.0 lb

## 2016-12-18 DIAGNOSIS — I10 Essential (primary) hypertension: Secondary | ICD-10-CM | POA: Diagnosis not present

## 2016-12-18 DIAGNOSIS — M48061 Spinal stenosis, lumbar region without neurogenic claudication: Secondary | ICD-10-CM

## 2016-12-18 MED ORDER — NEBIVOLOL HCL 5 MG PO TABS: 5.0000 mg | ORAL_TABLET | Freq: Every day | ORAL | 0 refills | Status: DC

## 2016-12-18 NOTE — Progress Notes (Signed)
Subjective:  Patient ID: Thomas Berry, male    DOB: 1970-09-27  Age: 46 y.o. MRN: [id number]  CC: Hypertension and Back Pain   HPI Thomas Berry presents for a BP check and f/up on LBP that radiates into his LLE with tingling in his LLE. He has gotten moderate sx relief with motrin. His BP has not been well controlled.  Outpatient Medications Prior to Visit  Medication Sig Dispense Refill  . desonide (DESOWEN) 0.05 % lotion Apply topically 2 (two) times daily. 59 mL 3  . fexofenadine (ALLEGRA) 180 MG tablet Take 1 tablet (180 mg total) by mouth daily as needed (for allergies). 90 tablet 3  . fluticasone (FLONASE) 50 MCG/ACT nasal spray Place 2 sprays into both nostrils daily. 48 g 3  . olmesartan-hydrochlorothiazide (BENICAR HCT) 40-25 MG tablet Take 1 tablet by mouth daily. 90 tablet 1   No facility-administered medications prior to visit.     ROS Review of Systems  Constitutional: Negative.  Negative for appetite change, diaphoresis, fatigue and unexpected weight change.  HENT: Negative.   Eyes: Negative for visual disturbance.  Respiratory: Negative.  Negative for apnea, cough, chest tightness, shortness of breath and wheezing.   Cardiovascular: Negative for chest pain, palpitations and leg swelling.  Gastrointestinal: Negative for abdominal pain, constipation, diarrhea, nausea and vomiting.  Endocrine: Negative.   Genitourinary: Negative.  Negative for difficulty urinating.  Musculoskeletal: Positive for back pain. Negative for arthralgias, myalgias and neck pain.  Skin: Negative.   Allergic/Immunologic: Negative.   Neurological: Negative.  Negative for dizziness, weakness, light-headedness, numbness and headaches.  Hematological: Negative for adenopathy. Does not bruise/bleed easily.  Psychiatric/Behavioral: Negative.     Objective:  BP (!) 160/100 (BP Location: Left Arm, Patient Position: Sitting, Cuff Size: Normal)   Pulse (!) 104   Temp 99.5 F (37.5 C)  (Oral)   Resp 16   Ht 5\' 9"  (1.753 m)   Wt 182 lb (82.6 kg)   SpO2 99%   BMI 26.88 kg/m   BP Readings from Last 3 Encounters:  12/18/16 (!) 160/100  11/20/16 (!) 146/92  10/04/16 (!) 143/101    Wt Readings from Last 3 Encounters:  12/18/16 182 lb (82.6 kg)  11/20/16 183 lb (83 kg)  10/04/16 180 lb (81.6 kg)    Physical Exam  Constitutional: He is oriented to person, place, and time. No distress.  HENT:  Mouth/Throat: Oropharynx is clear and moist. No oropharyngeal exudate.  Eyes: Conjunctivae are normal. Right eye exhibits no discharge. Left eye exhibits no discharge. No scleral icterus.  Neck: Normal range of motion. Neck supple. No JVD present. No tracheal deviation present. No thyromegaly present.  Cardiovascular: Normal rate, regular rhythm and intact distal pulses.  Exam reveals no gallop and no friction rub.   No murmur heard. Pulmonary/Chest: Effort normal and breath sounds normal. No respiratory distress. He has no wheezes. He has no rales. He exhibits no tenderness.  Abdominal: Soft. Bowel sounds are normal. He exhibits no distension and no mass. There is no tenderness. There is no rebound and no guarding.  Musculoskeletal: Normal range of motion. He exhibits no edema, tenderness or deformity.  Lymphadenopathy:    He has no cervical adenopathy.  Neurological: He is alert and oriented to person, place, and time. He has normal reflexes. He displays normal reflexes. No cranial nerve deficit. He exhibits normal muscle tone. Coordination normal.  +LSLR in LLE - SLR in RLE  Skin: Skin is warm and dry. No rash noted.  No erythema. No pallor.  Vitals reviewed.   Lab Results  Component Value Date   WBC 5.7 07/31/2016   HGB 14.9 07/31/2016   HCT 43.1 07/31/2016   PLT 236.0 07/31/2016   GLUCOSE 98 07/31/2016   CHOL 215 (H) 09/27/2015   TRIG 104.0 09/27/2015   HDL 70.50 09/27/2015   LDLCALC 124 (H) 09/27/2015   ALT 54 (H) 07/31/2016   AST 61 (H) 07/31/2016   NA 137  07/31/2016   K 4.3 07/31/2016   CL 101 07/31/2016   CREATININE 0.79 07/31/2016   BUN 11 07/31/2016   CO2 30 07/31/2016   TSH 1.29 09/27/2015   PSA 0.65 09/27/2015   INR 1.1 (H) 10/15/2012   HGBA1C 4.7 07/31/2016    Mr Lumbar Spine Wo Contrast  Result Date: 12/09/2016 CLINICAL DATA:  Abnormal T12 via radiography. Left-sided low back pain radiating to the left leg. EXAM: MRI LUMBAR SPINE WITHOUT CONTRAST TECHNIQUE: Multiplanar, multisequence MR imaging of the lumbar spine was performed. No intravenous contrast was administered. COMPARISON:  Lumbar radiography 06/14/2016. Chest radiography 09/16/2009. FINDINGS: Segmentation: Based on the previous chest radiographs, there are 12 normal pairs of ribs, with additional ribs affecting the L1 vertebral body, fully formed on the left and diminutive on the right. Based on this, the vertebral body contact or alteration described at radiography is L1. Alignment:  Normal Vertebrae: Vertebral bodies are interpreted as normal by MRI. Very slight contour irregularity at the superior endplate of L1 is most likely developmental and is insignificant. There is some potential that could relate to a distant minor superior fracture, but if so this has healed without any significant deformity. Conus medullaris: Extends to the L1 level and appears normal. Paraspinal and other soft tissues: Normal Disc levels: Normal at L2-3 and above.  Specifically, no T12-L1 disc abnormality. L3-4: Desiccation and bulging of the disc. Focal left foraminal disc herniation likely to compress the left L3 nerve. L4-5: Disc degeneration with annular fissures an annular bulging. Slight indentation of the thecal sac. Mild narrowing of the lateral recesses and neural foramina without distinct neural compression. Neural irritation could occur at this level however. L5-S1:  Normal interspace. IMPRESSION: The likely significant finding in this case is a left foraminal disc herniation at L3-4 likely to  compress the L3 nerve root. Disc degeneration at L4-5 with annular fissures and annular bulging. Mild stenosis of the lateral recesses and neural foramina. Neural compression is not demonstrated, but neural irritation could occur based on the findings at this level. Based on previous chest radiography, there are 12 normal sets of ribs and extra ribs associated with the L1 vertebra. Therefore, the minimal superior endplate deformity at radiography is at L1 rather than T12. This could either be developmental or related to an old healed minimal superior endplate fracture, without sequela. Electronically Signed   By: Paulina FusiMark  Shogry M.D.   On: 12/09/2016 09:10    Assessment & Plan:   Thomas Berry was seen today for hypertension and back pain.  Diagnoses and all orders for this visit:  Essential hypertension- his BP is not well controlled, will add Bystolic to the ARB/thiazide combination -     nebivolol (BYSTOLIC) 5 MG tablet; Take 1 tablet (5 mg total) by mouth daily.  Spinal stenosis of lumbar region, unspecified whether neurogenic claudication present- recent MRI was remarkable for disc herniation with stenosis, will refer to NS to see if he would benefit from surgery -     Ambulatory referral to Neurosurgery   I  am having Thomas Berry start on nebivolol. I am also having him maintain his fexofenadine, olmesartan-hydrochlorothiazide, desonide, and fluticasone.  Meds ordered this encounter  Medications  . nebivolol (BYSTOLIC) 5 MG tablet    Sig: Take 1 tablet (5 mg total) by mouth daily.    Dispense:  42 tablet    Refill:  0     Follow-up: No Follow-up on file.  Sanda Linger, MD

## 2016-12-18 NOTE — Patient Instructions (Signed)

## 2016-12-18 NOTE — Progress Notes (Signed)
Pre visit review using our clinic review tool, if applicable. No additional management support is needed unless otherwise documented below in the visit note. 

## 2016-12-19 ENCOUNTER — Encounter: Payer: Self-pay | Admitting: Internal Medicine

## 2017-01-15 ENCOUNTER — Ambulatory Visit: Payer: Self-pay | Admitting: Internal Medicine

## 2017-06-19 ENCOUNTER — Emergency Department (HOSPITAL_COMMUNITY)
Admission: EM | Admit: 2017-06-19 | Discharge: 2017-06-19 | Disposition: A | Discharge status: Home / Self Care | Payer: Commercial Managed Care - PPO | Attending: Emergency Medicine | Admitting: Emergency Medicine

## 2017-06-19 ENCOUNTER — Encounter (HOSPITAL_COMMUNITY): Payer: Self-pay | Admitting: Emergency Medicine

## 2017-06-19 ENCOUNTER — Other Ambulatory Visit: Payer: Self-pay

## 2017-06-19 DIAGNOSIS — Y999 Unspecified external cause status: Secondary | ICD-10-CM | POA: Diagnosis not present

## 2017-06-19 DIAGNOSIS — W228XXA Striking against or struck by other objects, initial encounter: Secondary | ICD-10-CM | POA: Insufficient documentation

## 2017-06-19 DIAGNOSIS — J45909 Unspecified asthma, uncomplicated: Secondary | ICD-10-CM | POA: Diagnosis not present

## 2017-06-19 DIAGNOSIS — S0990XA Unspecified injury of head, initial encounter: Secondary | ICD-10-CM | POA: Insufficient documentation

## 2017-06-19 DIAGNOSIS — I10 Essential (primary) hypertension: Secondary | ICD-10-CM | POA: Diagnosis not present

## 2017-06-19 DIAGNOSIS — Z79899 Other long term (current) drug therapy: Secondary | ICD-10-CM | POA: Diagnosis not present

## 2017-06-19 DIAGNOSIS — R410 Disorientation, unspecified: Secondary | ICD-10-CM | POA: Diagnosis not present

## 2017-06-19 DIAGNOSIS — Y9389 Activity, other specified: Secondary | ICD-10-CM | POA: Insufficient documentation

## 2017-06-19 DIAGNOSIS — Y929 Unspecified place or not applicable: Secondary | ICD-10-CM | POA: Diagnosis not present

## 2017-06-19 NOTE — ED Triage Notes (Signed)
Pt presents to ED after falling backwards off of a hover board.  Hit his head on concrete.  Denies dizziness, n/v, or AMS since the fall.  C/o 4/10 headache to back of head.  No hematoma or lacerations noted.

## 2017-06-19 NOTE — ED Provider Notes (Signed)
[facility] EMERGENCY [facility] Provider Note   CSN: [id number] Arrival date & time: 06/19/17  2235     History   Chief Complaint Chief Complaint  Patient presents with  . Head Injury    HPI Thomas Berry is a 47 y.o. male.  Patient riding one of Thomas Berry children's hover board when Thomas Berry fell backwards, hitting shoulder first then Thomas Berry head. No loss of consciousness.    The history is provided by the patient. No language interpreter was used.  Head Injury   The incident occurred 3 to 5 hours ago. Thomas Berry came to the ER via walk-in. The injury mechanism was a fall. There was no loss of consciousness. There was no blood loss. The quality of the pain is described as throbbing. The pain is moderate. The pain has been fluctuating since the injury. Associated symptoms include disorientation. Pertinent negatives include no numbness, no blurred vision, no vomiting, no tinnitus, no weakness and no memory loss. Thomas Berry has tried acetaminophen for the symptoms. The treatment provided mild relief.    Past Medical History:  Diagnosis Date  . Abnormal EKG    09/20/09 LVH in limb leads  . Asthma   . ED (erectile dysfunction)   . Glaucoma   . HTN (hypertension)   . Hypertrophy of prostate with urinary obstruction and other lower urinary tract symptoms (LUTS)   . Other and unspecified alcohol dependence, episodic drinking behavior   . Routine general medical examination at a health care facility     Patient Active Problem List   Diagnosis Date Noted  . Abnormal x-ray of lumbar spine 11/21/2016  . Spinal stenosis of lumbar region 11/20/2016  . Fatty liver disease, nonalcoholic 10/15/2012  . Routine general medical examination at a health care facility 07/01/2012  . Eyelid eczema, unspecified laterality 07/01/2012  . Hyperglycemia 05/24/2011  . HTN (hypertension) 08/31/2010  . Allergic rhinitis 08/31/2010  . HYPERTROPHY PROSTATE W/UR OBST & OTH LUTS 07/08/2009  . Asthma 04/19/2009      History reviewed. No pertinent surgical history.     Home Medications    Prior to Admission medications   Medication Sig Start Date End Date Taking? Authorizing Provider  desonide (DESOWEN) 0.05 % lotion Apply topically 2 (two) times daily. 07/31/16   Etta GrandchildJones, Unique L, MD  fexofenadine (ALLEGRA) 180 MG tablet Take 1 tablet (180 mg total) by mouth daily as needed (for allergies). 07/31/16   Etta GrandchildJones, Khaden L, MD  fluticasone (FLONASE) 50 MCG/ACT nasal spray Place 2 sprays into both nostrils daily. 08/01/16   Etta GrandchildJones, Dmarcus L, MD  nebivolol (BYSTOLIC) 5 MG tablet Take 1 tablet (5 mg total) by mouth daily. 12/18/16   Etta GrandchildJones, Markis L, MD  olmesartan-hydrochlorothiazide (BENICAR HCT) 40-25 MG tablet Take 1 tablet by mouth daily. 07/31/16   Etta GrandchildJones, Rondrick L, MD    Family History Family History  Problem Relation Age of Onset  . Hypertension Other   . Stroke Other   . Cancer Neg Hx   . Alcohol abuse Neg Hx   . Diabetes Neg Hx   . Early death Neg Hx   . Heart disease Neg Hx   . Hyperlipidemia Neg Hx   . Kidney disease Neg Hx     Social History Social History   Tobacco Use  . Smoking status: Never Smoker  . Smokeless tobacco: Never Used  Substance Use Topics  . Alcohol use: Yes    Alcohol/week: 3.6 oz    Types: 3 Cans of beer, 3  Shots of liquor per week  . Drug use: No     Allergies   Ace inhibitors   Review of Systems Review of Systems  HENT: Negative for tinnitus.   Eyes: Negative for blurred vision.  Gastrointestinal: Negative for vomiting.  Neurological: Negative for weakness and numbness.  Psychiatric/Behavioral: Negative for memory loss.  All other systems reviewed and are negative.    Physical Exam Updated Vital Signs BP (!) 151/93 (BP Location: Right Arm)   Pulse (!) 102   Temp 98.2 F (36.8 C) (Oral)   Resp 17   SpO2 96%   Physical Exam  Constitutional: Thomas Berry is oriented to person, place, and time. Thomas Berry appears well-developed and well-nourished.  HENT:   Head: Atraumatic.  Eyes: Conjunctivae and EOM are normal. Pupils are equal, round, and reactive to light.  Neck: Normal range of motion. Neck supple.  Cardiovascular: Normal rate and regular rhythm.  Pulmonary/Chest: Effort normal and breath sounds normal.  Abdominal: Soft. Bowel sounds are normal.  Musculoskeletal: Normal range of motion. Thomas Berry exhibits no edema or deformity.  Neurological: Thomas Berry is alert and oriented to person, place, and time. Thomas Berry has normal strength. No cranial nerve deficit or sensory deficit. Coordination normal. GCS eye subscore is 4. GCS verbal subscore is 5. GCS motor subscore is 6.  Skin: Skin is warm and dry.  Psychiatric: Thomas Berry has a normal mood and affect.  Nursing note and vitals reviewed.    ED Treatments / Results  Labs (all labs ordered are listed, but only abnormal results are displayed) Labs Reviewed - No data to display  EKG  EKG Interpretation None       Radiology No results found.  Procedures Procedures (including critical care time)  Medications Ordered in ED Medications - No data to display   Initial Impression / Assessment and Plan / ED Course  I have reviewed the triage vital signs and the nursing notes.  Pertinent labs & imaging results that were available during my care of the patient were reviewed by me and considered in my medical decision making (see chart for details).     Patient symptoms consistent with minor head injury. No vomiting. No focal neurological deficits on physical exam. CT is not indicated at this time. Discussed  reasons to return to the emergency department including any new  severe headaches, disequilibrium, vomiting, double vision, extremity weakness, difficulty ambulating, or any other concerning symptoms. Patient will be discharged with information pertaining to diagnosis. Pt is safe for discharge at this time.  Final Clinical Impressions(s) / ED Diagnoses   Final diagnoses:  Minor head injury, initial  encounter    ED Discharge Orders    None       Felicie Morn, NP 06/19/17 2346    Gerhard Munch, MD 06/20/17 0025

## 2017-07-18 ENCOUNTER — Other Ambulatory Visit: Payer: Self-pay | Admitting: Internal Medicine

## 2017-07-18 DIAGNOSIS — I10 Essential (primary) hypertension: Secondary | ICD-10-CM

## 2017-07-19 ENCOUNTER — Other Ambulatory Visit: Payer: Self-pay | Admitting: Internal Medicine

## 2017-07-19 DIAGNOSIS — I10 Essential (primary) hypertension: Secondary | ICD-10-CM

## 2017-07-19 MED FILL — OLMESARTAN-HCTZ 40-25 MG TA: 40-25 | 30 days supply | Qty: 30 | Fill #0

## 2017-07-19 NOTE — Telephone Encounter (Signed)
Pt states he is out of the medication, next OV 07/25/17.  [facility] [place] [address] [phone] (Phone) [phone] (Fax)

## 2017-07-25 ENCOUNTER — Encounter: Payer: Self-pay | Admitting: Internal Medicine

## 2017-07-25 ENCOUNTER — Ambulatory Visit (INDEPENDENT_AMBULATORY_CARE_PROVIDER_SITE_OTHER): Payer: Commercial Managed Care - PPO | Admitting: Internal Medicine

## 2017-07-25 VITALS — BP 148/102 | HR 61 | Temp 98.1°F | Resp 16 | Ht 69.0 in | Wt 178.5 lb

## 2017-07-25 DIAGNOSIS — I10 Essential (primary) hypertension: Secondary | ICD-10-CM

## 2017-07-25 DIAGNOSIS — Z23 Encounter for immunization: Secondary | ICD-10-CM

## 2017-07-25 MED ORDER — NEBIVOLOL HCL 5 MG PO TABS: 5.0000 mg | ORAL_TABLET | Freq: Every day | ORAL | 0 refills | Status: DC

## 2017-07-25 MED ORDER — OLMESARTAN MEDOXOMIL-HCTZ 40-25 MG PO TABS: 1.0000 | ORAL_TABLET | Freq: Every day | ORAL | 0 refills | Status: DC

## 2017-07-25 NOTE — Patient Instructions (Signed)

## 2017-07-25 NOTE — Progress Notes (Signed)
Subjective:  Patient ID: Thomas Berry, male    DOB: [birth year]  Age: 47 y.o. MRN: [id number]  CC: Hypertension   HPI Thomas Hoithomas H Jollie presents for a BP check - He tells me his blood pressure has not recently been well controlled and he has not been compliant with his usual therapy.  Over the last year he lost his insurance and was not able to afford meds.  He has felt well recently and denies headache/blurred vision/chest pain/shortness of breath/palpitations/edema/fatigue.  He is not willing to do a physical or to have any lab work done today.  Outpatient Medications Prior to Visit  Medication Sig Dispense Refill  . desonide (DESOWEN) 0.05 % lotion Apply topically 2 (two) times daily. 59 mL 3  . fexofenadine (ALLEGRA) 180 MG tablet Take 1 tablet (180 mg total) by mouth daily as needed (for allergies). 90 tablet 3  . fluticasone (FLONASE) 50 MCG/ACT nasal spray Place 2 sprays into both nostrils daily. 48 g 3  . nebivolol (BYSTOLIC) 5 MG tablet Take 1 tablet (5 mg total) by mouth daily. 42 tablet 0  . olmesartan-hydrochlorothiazide (BENICAR HCT) 40-25 MG tablet TAKE 1 TABLET BY MOUTH DAILY. 30 tablet 0   No facility-administered medications prior to visit.     ROS Review of Systems  Constitutional: Negative for appetite change, diaphoresis and fatigue.  HENT: Negative.   Eyes: Negative.   Respiratory: Negative.  Negative for cough, chest tightness, shortness of breath and wheezing.   Cardiovascular: Negative for chest pain, palpitations and leg swelling.  Gastrointestinal: Negative for abdominal pain, diarrhea, nausea and vomiting.  Endocrine: Negative.   Genitourinary: Negative.  Negative for difficulty urinating.  Musculoskeletal: Negative.  Negative for back pain and neck pain.  Skin: Negative.   Neurological: Negative.  Negative for dizziness, weakness and light-headedness.  Hematological: Negative for adenopathy. Does not bruise/bleed easily.    Objective:  BP (!)  148/102 (BP Location: Left Arm, Patient Position: Sitting, Cuff Size: Normal)   Pulse 61   Temp 98.1 F (36.7 C) (Oral)   Resp 16   Ht 5\' 9"  (1.753 m)   Wt 178 lb 8 oz (81 kg)   SpO2 99%   BMI 26.36 kg/m   BP Readings from Last 3 Encounters:  07/25/17 (!) 148/102  06/19/17 (!) 151/93  12/18/16 (!) 160/100    Wt Readings from Last 3 Encounters:  07/25/17 178 lb 8 oz (81 kg)  12/18/16 182 lb (82.6 kg)  11/20/16 183 lb (83 kg)    Physical Exam  Constitutional: He is oriented to person, place, and time. No distress.  HENT:  Mouth/Throat: Oropharynx is clear and moist. No oropharyngeal exudate.  Eyes: Conjunctivae are normal. Left eye exhibits no discharge. No scleral icterus.  Neck: Normal range of motion. Neck supple. No JVD present. No thyromegaly present.  Cardiovascular: Normal rate, regular rhythm and normal heart sounds. Exam reveals no gallop.  No murmur heard. Pulmonary/Chest: Effort normal and breath sounds normal. No respiratory distress. He has no wheezes. He has no rales. He exhibits no tenderness.  Abdominal: Soft. Bowel sounds are normal. He exhibits no distension and no mass. There is no tenderness.  Musculoskeletal: Normal range of motion. He exhibits no edema, tenderness or deformity.  Lymphadenopathy:    He has no cervical adenopathy.  Neurological: He is alert and oriented to person, place, and time.  Skin: Skin is warm and dry. No rash noted. He is not diaphoretic. No erythema. No pallor.  Vitals reviewed.  Lab Results  Component Value Date   WBC 5.7 07/31/2016   HGB 14.9 07/31/2016   HCT 43.1 07/31/2016   PLT 236.0 07/31/2016   GLUCOSE 98 07/31/2016   CHOL 215 (H) 09/27/2015   TRIG 104.0 09/27/2015   HDL 70.50 09/27/2015   LDLCALC 124 (H) 09/27/2015   ALT 54 (H) 07/31/2016   AST 61 (H) 07/31/2016   NA 137 07/31/2016   K 4.3 07/31/2016   CL 101 07/31/2016   CREATININE 0.79 07/31/2016   BUN 11 07/31/2016   CO2 30 07/31/2016   TSH 1.29  09/27/2015   PSA 0.65 09/27/2015   INR 1.1 (H) 10/15/2012   HGBA1C 4.7 07/31/2016    No results found.  Assessment & Plan:   Alezander was seen today for hypertension.  Diagnoses and all orders for this visit:  Need for influenza vaccination -     Flu Vaccine QUAD 36+ mos IM  Essential hypertension- His blood pressure is not adequately well controlled.  Will restart nebivolol, olmesartan, and hydrochlorothiazide.  He agrees to return in 30 days for a recheck of his blood pressure with lab work and a physical. -     nebivolol (BYSTOLIC) 5 MG tablet; Take 1 tablet (5 mg total) by mouth daily. -     olmesartan-hydrochlorothiazide (BENICAR HCT) 40-25 MG tablet; Take 1 tablet by mouth daily.   I am having Thomas Hoit maintain his fexofenadine, desonide, fluticasone, nebivolol, and olmesartan-hydrochlorothiazide.  Meds ordered this encounter  Medications  . nebivolol (BYSTOLIC) 5 MG tablet    Sig: Take 1 tablet (5 mg total) by mouth daily.    Dispense:  30 tablet    Refill:  0  . olmesartan-hydrochlorothiazide (BENICAR HCT) 40-25 MG tablet    Sig: Take 1 tablet by mouth daily.    Dispense:  30 tablet    Refill:  0     Follow-up: Return in about 4 weeks (around 08/22/2017).  Sanda Linger, MD

## 2017-07-30 ENCOUNTER — Ambulatory Visit: Payer: Self-pay | Admitting: Internal Medicine

## 2017-08-23 ENCOUNTER — Encounter: Payer: Self-pay | Admitting: Internal Medicine

## 2017-08-23 DIAGNOSIS — Z0289 Encounter for other administrative examinations: Secondary | ICD-10-CM

## 2017-08-28 MED FILL — OLMESARTAN-HCTZ 40-25 MG TA: 40-25 | 30 days supply | Qty: 30 | Fill #0

## 2017-10-17 ENCOUNTER — Other Ambulatory Visit: Payer: Self-pay | Admitting: Internal Medicine

## 2017-10-17 DIAGNOSIS — I10 Essential (primary) hypertension: Secondary | ICD-10-CM

## 2017-10-17 MED FILL — OLMESARTAN-HCTZ 40-25 MG TA: 40-25 | 30 days supply | Qty: 30 | Fill #0

## 2017-11-12 ENCOUNTER — Encounter: Payer: Self-pay | Admitting: Internal Medicine

## 2017-11-12 ENCOUNTER — Other Ambulatory Visit (INDEPENDENT_AMBULATORY_CARE_PROVIDER_SITE_OTHER): Payer: Commercial Managed Care - PPO

## 2017-11-12 ENCOUNTER — Ambulatory Visit (INDEPENDENT_AMBULATORY_CARE_PROVIDER_SITE_OTHER): Payer: Commercial Managed Care - PPO | Admitting: Internal Medicine

## 2017-11-12 VITALS — BP 132/70 | HR 89 | Temp 98.7°F | Resp 16 | Ht 69.0 in | Wt 197.5 lb

## 2017-11-12 DIAGNOSIS — Z0000 Encounter for general adult medical examination without abnormal findings: Secondary | ICD-10-CM

## 2017-11-12 DIAGNOSIS — K760 Fatty (change of) liver, not elsewhere classified: Secondary | ICD-10-CM | POA: Diagnosis not present

## 2017-11-12 DIAGNOSIS — R21 Rash and other nonspecific skin eruption: Secondary | ICD-10-CM

## 2017-11-12 DIAGNOSIS — I10 Essential (primary) hypertension: Secondary | ICD-10-CM

## 2017-11-12 DIAGNOSIS — E559 Vitamin D deficiency, unspecified: Secondary | ICD-10-CM | POA: Diagnosis not present

## 2017-11-12 DIAGNOSIS — A5149 Other secondary syphilitic conditions: Secondary | ICD-10-CM | POA: Diagnosis not present

## 2017-11-12 LAB — COMPREHENSIVE METABOLIC PANEL
ALT: 44 U/L (ref 0–53)
AST: 48 U/L — AB (ref 0–37)
Albumin: 4.3 g/dL (ref 3.5–5.2)
Alkaline Phosphatase: 47 U/L (ref 39–117)
BUN: 16 mg/dL (ref 6–23)
CHLORIDE: 98 meq/L (ref 96–112)
CO2: 32 meq/L (ref 19–32)
CREATININE: 0.9 mg/dL (ref 0.40–1.50)
Calcium: 9.6 mg/dL (ref 8.4–10.5)
GFR: 115.21 mL/min (ref >60.00)
GLUCOSE: 94 mg/dL (ref 70–99)
Potassium: 3.8 mEq/L (ref 3.5–5.1)
SODIUM: 136 meq/L (ref 135–145)
Total Bilirubin: 0.8 mg/dL (ref 0.2–1.2)
Total Protein: 7.3 g/dL (ref 6.0–8.3)

## 2017-11-12 LAB — URINALYSIS, ROUTINE W REFLEX MICROSCOPIC
Bilirubin Urine: NEGATIVE
Hgb urine dipstick: NEGATIVE
Ketones, ur: NEGATIVE
Leukocytes, UA: NEGATIVE
Nitrite: NEGATIVE
PH: 6 (ref 5.0–8.0)
RBC / HPF: NONE SEEN (ref 0–?)
SPECIFIC GRAVITY, URINE: 1.02 (ref 1.000–1.030)
TOTAL PROTEIN, URINE-UPE24: NEGATIVE
URINE GLUCOSE: NEGATIVE
Urobilinogen, UA: 4 — AB (ref 0.0–1.0)

## 2017-11-12 LAB — LIPID PANEL
CHOL/HDL RATIO: 3
Cholesterol: 172 mg/dL (ref 0–200)
HDL: 49.9 mg/dL (ref >39.00)
LDL CALC: 101 mg/dL — AB (ref 0–99)
NonHDL: 122.22
Triglycerides: 105 mg/dL (ref 0.0–149.0)
VLDL: 21 mg/dL (ref 0.0–40.0)

## 2017-11-12 LAB — CBC WITH DIFFERENTIAL/PLATELET
Basophils Absolute: 0.1 10*3/uL (ref 0.0–0.1)
Basophils Relative: 1.2 % (ref 0.0–3.0)
EOS PCT: 4 % (ref 0.0–5.0)
Eosinophils Absolute: 0.3 10*3/uL (ref 0.0–0.7)
HCT: 42.5 % (ref 39.0–52.0)
Hemoglobin: 14.9 g/dL (ref 13.0–17.0)
Lymphocytes Relative: 22.5 % (ref 12.0–46.0)
Lymphs Abs: 1.7 10*3/uL (ref 0.7–4.0)
MCHC: 35 g/dL (ref 30.0–36.0)
MCV: 97.9 fl (ref 78.0–100.0)
MONO ABS: 0.8 10*3/uL (ref 0.1–1.0)
MONOS PCT: 9.9 % (ref 3.0–12.0)
NEUTROS ABS: 4.8 10*3/uL (ref 1.4–7.7)
NEUTROS PCT: 62.4 % (ref 43.0–77.0)
PLATELETS: 242 10*3/uL (ref 150.0–400.0)
RBC: 4.34 Mil/uL (ref 4.22–5.81)
RDW: 12 % (ref 11.5–15.5)
WBC: 7.7 10*3/uL (ref 4.0–10.5)

## 2017-11-12 LAB — TSH: TSH: 1.35 u[IU]/mL (ref 0.35–4.50)

## 2017-11-12 LAB — PSA: PSA: 0.65 ng/mL (ref 0.10–4.00)

## 2017-11-12 LAB — VITAMIN D 25 HYDROXY (VIT D DEFICIENCY, FRACTURES): VITD: 16.55 ng/mL — AB (ref 30.00–100.00)

## 2017-11-12 MED ORDER — OLMESARTAN MEDOXOMIL-HCTZ 40-25 MG PO TABS: 1.0000 | ORAL_TABLET | Freq: Every day | ORAL | 1 refills | Status: DC

## 2017-11-12 MED ORDER — NEBIVOLOL HCL 5 MG PO TABS: 5.0000 mg | ORAL_TABLET | Freq: Every day | ORAL | 1 refills | Status: DC

## 2017-11-12 NOTE — Progress Notes (Signed)
Subjective:  Patient ID: Thomas Berry, male    DOB: 1971/01/05  Age: 47 y.o. MRN: [id number]  CC: Annual Exam and Hypertension   HPI METE PURDUM presents for a CPX.  He donates plasma and was recently told that he had a positive RPR.  Over the last few weeks he has had a rash over his upper chest that he describes as red bumps with mild burning.    He tells me his blood pressure has been well controlled.  He is very active and denies any recent episodes of CP, DOE, SOB, palpitations, edema, or fatigue.  Outpatient Medications Prior to Visit  Medication Sig Dispense Refill  . desonide (DESOWEN) 0.05 % lotion Apply topically 2 (two) times daily. 59 mL 3  . fexofenadine (ALLEGRA) 180 MG tablet Take 1 tablet (180 mg total) by mouth daily as needed (for allergies). 90 tablet 3  . fluticasone (FLONASE) 50 MCG/ACT nasal spray Place 2 sprays into both nostrils daily. 48 g 3  . nebivolol (BYSTOLIC) 5 MG tablet Take 1 tablet (5 mg total) by mouth daily. 30 tablet 0  . olmesartan-hydrochlorothiazide (BENICAR HCT) 40-25 MG tablet TAKE 1 TABLET BY MOUTH DAILY. 30 tablet 0   No facility-administered medications prior to visit.     ROS Review of Systems  Constitutional: Negative for chills, diaphoresis, fatigue and fever.  HENT: Negative.  Negative for sore throat, trouble swallowing and voice change.   Eyes: Negative.  Negative for visual disturbance.  Respiratory: Negative.  Negative for cough, chest tightness, shortness of breath and wheezing.   Cardiovascular: Negative for chest pain, palpitations and leg swelling.  Gastrointestinal: Negative for abdominal pain, constipation, diarrhea, nausea and vomiting.  Endocrine: Negative.   Genitourinary: Negative.  Negative for difficulty urinating, discharge, dysuria, genital sores, hematuria, penile swelling, scrotal swelling, testicular pain and urgency.  Musculoskeletal: Negative.  Negative for myalgias.  Skin: Positive for rash.  Negative for color change and pallor.  Neurological: Negative.  Negative for dizziness, weakness, light-headedness and headaches.  Hematological: Negative for adenopathy. Does not bruise/bleed easily.  Psychiatric/Behavioral: Negative.  Negative for sleep disturbance. The patient is not nervous/anxious.     Objective:  BP 132/70 (BP Location: Left Arm, Patient Position: Sitting, Cuff Size: Normal)   Pulse 89   Temp 98.7 F (37.1 C) (Oral)   Resp 16   Ht 5\' 9"  (1.753 m)   Wt 197 lb 8 oz (89.6 kg)   SpO2 99%   BMI 29.17 kg/m   BP Readings from Last 3 Encounters:  11/12/17 132/70  07/25/17 (!) 148/102  06/19/17 (!) 151/93    Wt Readings from Last 3 Encounters:  11/12/17 197 lb 8 oz (89.6 kg)  07/25/17 178 lb 8 oz (81 kg)  12/18/16 182 lb (82.6 kg)    Physical Exam  Constitutional: He is oriented to person, place, and time. No distress.  HENT:  Mouth/Throat: Oropharynx is clear and moist. No oropharyngeal exudate.  Eyes: Conjunctivae are normal. No scleral icterus.  Neck: Normal range of motion. Neck supple. No JVD present. No thyromegaly present.  Cardiovascular: Normal rate, regular rhythm and normal heart sounds. Exam reveals no gallop.  No murmur heard. EKG ---  Sinus  Rhythm  WITHIN NORMAL LIMITS  Pulmonary/Chest: Effort normal and breath sounds normal. No respiratory distress. He has no wheezes. He has no rales.  Abdominal: Soft. Bowel sounds are normal. He exhibits no mass. There is no hepatosplenomegaly. There is no tenderness. Hernia confirmed negative in the  right inguinal area and confirmed negative in the left inguinal area.  Genitourinary: Rectum normal, prostate normal, testes normal and penis normal. Rectal exam shows no external hemorrhoid, no internal hemorrhoid, no fissure, no mass, no tenderness, anal tone normal and guaiac negative stool. Prostate is not enlarged and not tender. Right testis shows no mass, no swelling and no tenderness. Left testis shows no  mass, no swelling and no tenderness. Uncircumcised. No phimosis, paraphimosis, hypospadias, penile erythema or penile tenderness. No discharge found.  Musculoskeletal: Normal range of motion. He exhibits no edema, tenderness or deformity.  Lymphadenopathy:    He has no cervical adenopathy. No inguinal adenopathy noted on the right or left side.  Neurological: He is alert and oriented to person, place, and time.  Skin: Skin is warm and dry. Rash noted. Rash is macular. He is not diaphoretic. No pallor.  Over the upper torso, bilaterally but more on the right than the left, there are faint erythematous macules.  Vitals reviewed.   Lab Results  Component Value Date   WBC 7.7 11/12/2017   HGB 14.9 11/12/2017   HCT 42.5 11/12/2017   PLT 242.0 11/12/2017   GLUCOSE 94 11/12/2017   CHOL 172 11/12/2017   TRIG 105.0 11/12/2017   HDL 49.90 11/12/2017   LDLCALC 101 (H) 11/12/2017   ALT 44 11/12/2017   AST 48 (H) 11/12/2017   NA 136 11/12/2017   K 3.8 11/12/2017   CL 98 11/12/2017   CREATININE 0.90 11/12/2017   BUN 16 11/12/2017   CO2 32 11/12/2017   TSH 1.35 11/12/2017   PSA 0.65 11/12/2017   INR 1.1 (H) 10/15/2012   HGBA1C 4.7 07/31/2016    No results found.  Assessment & Plan:   Maisie Fushomas was seen today for annual exam and hypertension.  Diagnoses and all orders for this visit:  Fatty liver disease, nonalcoholic- His AST and ALT remain mildly elevated.  He was encouraged to improve his lifestyle modifications. -     Comprehensive metabolic panel; Future  Essential hypertension- His BP is adequately well controlled.  I will treat the vitamin D deficiency.  His labs are negative for secondary causes or endorgan damage.  His EKG is negative for LVH or ischemia.  Will continue the current combination of nebivolol, and ARB, and thiazide diuretic. -     nebivolol (BYSTOLIC) 5 MG tablet; Take 1 tablet (5 mg total) by mouth daily. -     olmesartan-hydrochlorothiazide (BENICAR HCT) 40-25 MG  tablet; Take 1 tablet by mouth daily. -     CBC with Differential/Platelet; Future -     TSH; Future -     Urinalysis, Routine w reflex microscopic; Future -     VITAMIN D 25 Hydroxy (Vit-D Deficiency, Fractures); Future -     Comprehensive metabolic panel; Future -     EKG 12-Lead  Routine general medical examination at a health care facility- Exam completed, labs reviewed, vaccines reviewed, patient education material was given. -     PSA; Future -     Lipid panel; Future -     HIV antibody; Future  Rash- His RPR is positive.  Will treat him for secondary syphilis. -     RPR; Future  Vitamin D deficiency disease -     Cholecalciferol 50000 units capsule; Take 1 capsule (50,000 Units total) by mouth once a week.  Secondary syphilis in male- We have notified him of his result.  I have asked him to start treatment with Bicillin LA  2,400,000 units once a week for 3 weeks.   I am having Sydnee Cabal. Bembenek start on Cholecalciferol. I am also having him maintain his fexofenadine, desonide, fluticasone, nebivolol, and olmesartan-hydrochlorothiazide.  Meds ordered this encounter  Medications  . nebivolol (BYSTOLIC) 5 MG tablet    Sig: Take 1 tablet (5 mg total) by mouth daily.    Dispense:  90 tablet    Refill:  1  . olmesartan-hydrochlorothiazide (BENICAR HCT) 40-25 MG tablet    Sig: Take 1 tablet by mouth daily.    Dispense:  90 tablet    Refill:  1  . Cholecalciferol 50000 units capsule    Sig: Take 1 capsule (50,000 Units total) by mouth once a week.    Dispense:  12 capsule    Refill:  1     Follow-up: Return in about 6 months (around 05/14/2018).  Sanda Linger, MD

## 2017-11-12 NOTE — Patient Instructions (Signed)

## 2017-11-13 DIAGNOSIS — R21 Rash and other nonspecific skin eruption: Secondary | ICD-10-CM | POA: Insufficient documentation

## 2017-11-13 DIAGNOSIS — A5149 Other secondary syphilitic conditions: Secondary | ICD-10-CM | POA: Insufficient documentation

## 2017-11-13 DIAGNOSIS — E559 Vitamin D deficiency, unspecified: Secondary | ICD-10-CM | POA: Insufficient documentation

## 2017-11-13 LAB — HIV ANTIBODY (ROUTINE TESTING W REFLEX): HIV: NONREACTIVE

## 2017-11-13 LAB — FLUORESCENT TREPONEMAL AB(FTA)-IGG-BLD: FLUORESCENT TREPONEMAL ABS: REACTIVE — AB

## 2017-11-13 LAB — RPR: RPR Ser Ql: REACTIVE — AB

## 2017-11-13 LAB — RPR TITER

## 2017-11-13 MED ORDER — CHOLECALCIFEROL 1.25 MG (50000 UT) PO CAPS: 50000.0000 [IU] | ORAL_CAPSULE | ORAL | 1 refills | Status: DC

## 2017-11-14 ENCOUNTER — Ambulatory Visit (INDEPENDENT_AMBULATORY_CARE_PROVIDER_SITE_OTHER): Payer: Commercial Managed Care - PPO

## 2017-11-14 DIAGNOSIS — A539 Syphilis, unspecified: Secondary | ICD-10-CM

## 2017-11-14 MED ORDER — PENICILLIN G BENZATHINE 2400000 UNIT/4ML IM SUSP
2.4000 10*6.[IU] | Freq: Once | INTRAMUSCULAR | Status: AC
  Administered 2017-11-14: 2.4 10*6.[IU] via INTRAMUSCULAR

## 2017-11-16 ENCOUNTER — Telehealth: Payer: Self-pay

## 2017-11-16 NOTE — Telephone Encounter (Signed)
[facility] called regarding pt positive RPR -   Informed Cory regarding specific of positive RPR and if there were any symptoms of infection. OV notes stated that pt was informed by plasma center that RPR was positive and that he was positive for upper body rash and was red and burning.

## 2017-11-21 ENCOUNTER — Ambulatory Visit: Payer: Self-pay

## 2017-11-22 ENCOUNTER — Ambulatory Visit (INDEPENDENT_AMBULATORY_CARE_PROVIDER_SITE_OTHER): Payer: Commercial Managed Care - PPO

## 2017-11-22 DIAGNOSIS — A5149 Other secondary syphilitic conditions: Secondary | ICD-10-CM | POA: Diagnosis not present

## 2017-11-22 MED ORDER — PENICILLIN G BENZATHINE 2400000 UNIT/4ML IM SUSP
2.4000 10*6.[IU] | Freq: Once | INTRAMUSCULAR | Status: AC
  Administered 2017-11-22: 2.4 10*6.[IU] via INTRAMUSCULAR

## 2017-11-26 MED FILL — VIT D3-50 50,000 UNITS CAPS: 1.25 MG | 28 days supply | Qty: 4 | Fill #0

## 2017-11-26 MED FILL — BYSTOLIC 5 MG TABLET: 5 | 30 days supply | Qty: 30 | Fill #0

## 2017-11-26 MED FILL — OLMESARTAN-HCTZ 40-25 MG TA: 40-25 | 30 days supply | Qty: 30 | Fill #0

## 2017-11-28 ENCOUNTER — Ambulatory Visit: Payer: Commercial Managed Care - PPO

## 2017-11-29 ENCOUNTER — Telehealth: Payer: Self-pay | Admitting: *Deleted

## 2017-11-29 ENCOUNTER — Ambulatory Visit (INDEPENDENT_AMBULATORY_CARE_PROVIDER_SITE_OTHER): Payer: Commercial Managed Care - PPO | Admitting: *Deleted

## 2017-11-29 DIAGNOSIS — A5149 Other secondary syphilitic conditions: Secondary | ICD-10-CM | POA: Diagnosis not present

## 2017-11-29 MED ORDER — PENICILLIN G BENZATHINE 2400000 UNIT/4ML IM SUSP
2.4000 10*6.[IU] | Freq: Once | INTRAMUSCULAR | Status: AC
  Administered 2017-11-29: 2.4 10*6.[IU] via INTRAMUSCULAR

## 2017-11-29 NOTE — Telephone Encounter (Signed)
Error

## 2018-01-21 MED FILL — BYSTOLIC 5 MG TABLET: 5 | 30 days supply | Qty: 30 | Fill #1

## 2018-01-21 MED FILL — OLMESARTAN-HCTZ 40-25 MG TA: 40-25 | 30 days supply | Qty: 30 | Fill #1

## 2018-05-13 ENCOUNTER — Ambulatory Visit: Payer: Commercial Managed Care - PPO | Admitting: Internal Medicine

## 2018-05-13 DIAGNOSIS — Z0289 Encounter for other administrative examinations: Secondary | ICD-10-CM

## 2018-05-13 MED FILL — OLMESARTAN-HCTZ 40-25 MG TA: 40-25 | 30 days supply | Qty: 30 | Fill #3

## 2018-07-17 ENCOUNTER — Other Ambulatory Visit: Payer: Self-pay | Admitting: Internal Medicine

## 2018-07-17 DIAGNOSIS — I10 Essential (primary) hypertension: Secondary | ICD-10-CM

## 2018-07-17 MED ORDER — OLMESARTAN MEDOXOMIL-HCTZ 40-25 MG PO TABS: 1.0000 | ORAL_TABLET | Freq: Every day | ORAL | 0 refills | Status: DC

## 2018-07-17 MED FILL — OLMESARTAN-HCTZ 40-25 MG TA: 40-25 | 30 days supply | Qty: 30 | Fill #0

## 2018-07-17 NOTE — Telephone Encounter (Signed)
90 day courtesy refill. Appt scheduled on 07/22/18

## 2018-07-17 NOTE — Telephone Encounter (Signed)
Copied from CRM [id number]. Topic: Quick Communication - Rx Refill/Question >> Jul 17, 2018 11:27 AM Maia Petties wrote: Medication: olmesartan-hydrochlorothiazide (BENICAR HCT) 40-25 MG tablet - 2 pills left - takes 1/day - appt scheduled for 07/22/2018 with Dr. Yetta Barre - please send in refill so pt does not run out of medication  Has the patient contacted their pharmacy? Yes - told RX denied Preferred Pharmacy (with phone number or street name): [facility] - [place], [place] - [address]. [phone] (Phone) [phone] (Fax)

## 2018-07-22 ENCOUNTER — Ambulatory Visit: Payer: Commercial Managed Care - PPO | Admitting: Internal Medicine

## 2018-07-31 ENCOUNTER — Ambulatory Visit: Payer: Commercial Managed Care - PPO | Admitting: Internal Medicine

## 2018-08-22 MED FILL — OLMESARTAN-HCTZ 40-25 MG TA: 40-25 | 30 days supply | Qty: 30 | Fill #1 | Status: TO

## 2018-11-13 MED FILL — OLMESARTAN-HCTZ 40-25 MG TA: 40-25 | 30 days supply | Qty: 30 | Fill #0

## 2019-01-13 ENCOUNTER — Telehealth: Payer: Self-pay | Admitting: Internal Medicine

## 2019-01-13 NOTE — Telephone Encounter (Signed)
Medication Refill - Medication: olmesartan-hydrochlorothiazide (BENICAR HCT) 40-25 MG tablet, desonide (DESOWEN) 0.05 % lotion   Has the patient contacted their pharmacy? Yes.   (Agent: If no, request that the patient contact the pharmacy for the refill.) (Agent: If yes, when and what did the pharmacy advise?)  Preferred Pharmacy (with phone number or street name):  [facility] [place].  [address] [place] [address]  Phone: [phone] Fax: [phone]   Agent: Please be advised that RX refills may take up to 3 business days. We ask that you follow-up with your pharmacy.

## 2019-01-13 NOTE — Telephone Encounter (Signed)
Pt will have to have an appt for refills.

## 2019-01-14 ENCOUNTER — Other Ambulatory Visit: Payer: Self-pay | Admitting: Internal Medicine

## 2019-01-14 DIAGNOSIS — I10 Essential (primary) hypertension: Secondary | ICD-10-CM

## 2019-01-14 MED ORDER — OLMESARTAN MEDOXOMIL-HCTZ 40-25 MG PO TABS: 1.0000 | ORAL_TABLET | Freq: Every day | ORAL | 0 refills | Status: DC

## 2019-01-14 MED FILL — OLMESARTAN-HCTZ 40-25 MG TA: 40-25 | 30 days supply | Qty: 30 | Fill #0

## 2019-01-14 NOTE — Telephone Encounter (Signed)
Pt contacted and stated that he does not have insurance but should within the next month. Pt has scheduled an appt for 02/18/2019. Can a refill for Benicar, enough to get him to his appt?

## 2019-02-18 ENCOUNTER — Ambulatory Visit: Payer: Commercial Managed Care - PPO | Admitting: Internal Medicine

## 2019-02-25 MED FILL — OLMESARTAN-HCTZ 40-25 MG TA: 40-25 | 30 days supply | Qty: 30 | Fill #1

## 2019-03-24 MED FILL — OLMESARTAN-HCTZ 40-25 MG TA: 40-25 | 30 days supply | Qty: 30 | Fill #2

## 2019-05-07 ENCOUNTER — Other Ambulatory Visit: Payer: Self-pay

## 2019-05-07 DIAGNOSIS — Z20822 Contact with and (suspected) exposure to covid-19: Secondary | ICD-10-CM

## 2019-05-09 LAB — NOVEL CORONAVIRUS, NAA: SARS-CoV-2, NAA: NOT DETECTED

## 2019-06-26 ENCOUNTER — Other Ambulatory Visit: Payer: Self-pay | Admitting: Internal Medicine

## 2019-06-26 DIAGNOSIS — I10 Essential (primary) hypertension: Secondary | ICD-10-CM

## 2019-06-26 MED ORDER — OLMESARTAN MEDOXOMIL-HCTZ 40-25 MG PO TABS: 1.0000 | ORAL_TABLET | Freq: Every day | ORAL | 0 refills | Status: DC

## 2019-06-26 MED FILL — OLMESARTAN-HCTZ 40-25 MG TA: 40-25 | 30 days supply | Qty: 30 | Fill #0

## 2019-06-26 NOTE — Telephone Encounter (Signed)
Medication Refill - Medication: olmesartan-hydrochlorothiazide (BENICAR HCT) 40-25 MG tablet   Has the patient contacted their pharmacy? Yes.   (Agent: If no, request that the patient contact the pharmacy for the refill.) (Agent: If yes, when and what did the pharmacy advise?)  Preferred Pharmacy (with phone number or street name):  [facility] - [place], [place] - [address] [facility].  [address] [place] [address]  Phone: [phone] Fax: [phone]     Agent: Please be advised that RX refills may take up to 3 business days. We ask that you follow-up with your pharmacy.

## 2019-07-30 ENCOUNTER — Ambulatory Visit: Payer: Self-pay | Admitting: Internal Medicine

## 2019-07-31 ENCOUNTER — Ambulatory Visit: Payer: Self-pay | Admitting: Internal Medicine

## 2019-08-27 ENCOUNTER — Ambulatory Visit: Payer: Self-pay | Admitting: Internal Medicine

## 2019-08-28 ENCOUNTER — Ambulatory Visit (INDEPENDENT_AMBULATORY_CARE_PROVIDER_SITE_OTHER): Payer: Self-pay | Admitting: Internal Medicine

## 2019-08-28 ENCOUNTER — Encounter: Payer: Self-pay | Admitting: Internal Medicine

## 2019-08-28 ENCOUNTER — Other Ambulatory Visit: Payer: Self-pay

## 2019-08-28 VITALS — BP 172/102 | HR 83 | Temp 98.0°F | Resp 16 | Ht 69.0 in | Wt 186.4 lb

## 2019-08-28 DIAGNOSIS — I10 Essential (primary) hypertension: Secondary | ICD-10-CM

## 2019-08-28 DIAGNOSIS — Z0000 Encounter for general adult medical examination without abnormal findings: Secondary | ICD-10-CM

## 2019-08-28 DIAGNOSIS — A5149 Other secondary syphilitic conditions: Secondary | ICD-10-CM

## 2019-08-28 DIAGNOSIS — Z1211 Encounter for screening for malignant neoplasm of colon: Secondary | ICD-10-CM

## 2019-08-28 DIAGNOSIS — K760 Fatty (change of) liver, not elsewhere classified: Secondary | ICD-10-CM

## 2019-08-28 DIAGNOSIS — E785 Hyperlipidemia, unspecified: Secondary | ICD-10-CM

## 2019-08-28 DIAGNOSIS — J301 Allergic rhinitis due to pollen: Secondary | ICD-10-CM

## 2019-08-28 DIAGNOSIS — E559 Vitamin D deficiency, unspecified: Secondary | ICD-10-CM

## 2019-08-28 DIAGNOSIS — H01139 Eczematous dermatitis of unspecified eye, unspecified eyelid: Secondary | ICD-10-CM

## 2019-08-28 LAB — CBC WITH DIFFERENTIAL/PLATELET
Basophils Absolute: 0 10*3/uL (ref 0.0–0.1)
Basophils Relative: 0.7 % (ref 0.0–3.0)
Eosinophils Absolute: 0.3 10*3/uL (ref 0.0–0.7)
Eosinophils Relative: 6.3 % — ABNORMAL HIGH (ref 0.0–5.0)
HCT: 39.5 % (ref 39.0–52.0)
Hemoglobin: 13.7 g/dL (ref 13.0–17.0)
Lymphocytes Relative: 25.2 % (ref 12.0–46.0)
Lymphs Abs: 1.1 10*3/uL (ref 0.7–4.0)
MCHC: 34.5 g/dL (ref 30.0–36.0)
MCV: 100.1 fl — ABNORMAL HIGH (ref 78.0–100.0)
Monocytes Absolute: 0.7 10*3/uL (ref 0.1–1.0)
Monocytes Relative: 16.5 % — ABNORMAL HIGH (ref 3.0–12.0)
Neutro Abs: 2.3 10*3/uL (ref 1.4–7.7)
Neutrophils Relative %: 51.3 % (ref 43.0–77.0)
Platelets: 178 10*3/uL (ref 150.0–400.0)
RBC: 3.95 Mil/uL — ABNORMAL LOW (ref 4.22–5.81)
RDW: 12.8 % (ref 11.5–15.5)
WBC: 4.5 10*3/uL (ref 4.0–10.5)

## 2019-08-28 LAB — HEPATIC FUNCTION PANEL
ALT: 36 U/L (ref 0–53)
AST: 60 U/L — ABNORMAL HIGH (ref 0–37)
Albumin: 4.1 g/dL (ref 3.5–5.2)
Alkaline Phosphatase: 42 U/L (ref 39–117)
Bilirubin, Direct: 0.3 mg/dL (ref 0.0–0.3)
Total Bilirubin: 0.9 mg/dL (ref 0.2–1.2)
Total Protein: 7.8 g/dL (ref 6.0–8.3)

## 2019-08-28 LAB — LIPID PANEL
Cholesterol: 152 mg/dL (ref 0–200)
HDL: 48.2 mg/dL (ref >39.00)
LDL Cholesterol: 76 mg/dL (ref 0–99)
NonHDL: 103.34
Total CHOL/HDL Ratio: 3
Triglycerides: 136 mg/dL (ref 0.0–149.0)
VLDL: 27.2 mg/dL (ref 0.0–40.0)

## 2019-08-28 LAB — PSA: PSA: 1.15 ng/mL (ref 0.10–4.00)

## 2019-08-28 LAB — URINALYSIS, ROUTINE W REFLEX MICROSCOPIC
Bilirubin Urine: NEGATIVE
Hgb urine dipstick: NEGATIVE
Ketones, ur: NEGATIVE
Leukocytes,Ua: NEGATIVE
Nitrite: NEGATIVE
RBC / HPF: NONE SEEN (ref 0–?)
Specific Gravity, Urine: 1.025 (ref 1.000–1.030)
Total Protein, Urine: NEGATIVE
Urine Glucose: NEGATIVE
Urobilinogen, UA: 1 (ref 0.0–1.0)
WBC, UA: NONE SEEN (ref 0–?)
pH: 5.5 (ref 5.0–8.0)

## 2019-08-28 LAB — BASIC METABOLIC PANEL
BUN: 11 mg/dL (ref 6–23)
CO2: 28 mEq/L (ref 19–32)
Calcium: 9.3 mg/dL (ref 8.4–10.5)
Chloride: 102 mEq/L (ref 96–112)
Creatinine, Ser: 0.85 mg/dL (ref 0.40–1.50)
GFR: 114.95 mL/min (ref >60.00)
Glucose, Bld: 96 mg/dL (ref 70–99)
Potassium: 3.9 mEq/L (ref 3.5–5.1)
Sodium: 137 mEq/L (ref 135–145)

## 2019-08-28 LAB — PROTIME-INR
INR: 1.1 ratio — ABNORMAL HIGH (ref 0.8–1.0)
Prothrombin Time: 12.5 s (ref 9.6–13.1)

## 2019-08-28 LAB — TSH: TSH: 2.08 u[IU]/mL (ref 0.35–4.50)

## 2019-08-28 LAB — VITAMIN D 25 HYDROXY (VIT D DEFICIENCY, FRACTURES): VITD: 14.73 ng/mL — ABNORMAL LOW (ref 30.00–100.00)

## 2019-08-28 MED ORDER — INDAPAMIDE 1.25 MG PO TABS: 1.2500 mg | ORAL_TABLET | Freq: Every day | ORAL | 0 refills | Status: DC

## 2019-08-28 MED ORDER — FEXOFENADINE HCL 180 MG PO TABS: 180.0000 mg | ORAL_TABLET | Freq: Every day | ORAL | 1 refills | Status: AC | PRN

## 2019-08-28 MED ORDER — CHOLECALCIFEROL 50 MCG (2000 UT) PO TABS: 1.0000 | ORAL_TABLET | Freq: Every day | ORAL | 1 refills | Status: DC

## 2019-08-28 MED ORDER — ATORVASTATIN CALCIUM 10 MG PO TABS: 10.0000 mg | ORAL_TABLET | Freq: Every day | ORAL | 1 refills | Status: DC

## 2019-08-28 MED ORDER — DESONIDE 0.05 % EX LOTN: TOPICAL_LOTION | Freq: Two times a day (BID) | CUTANEOUS | 3 refills | Status: DC

## 2019-08-28 MED ORDER — CARVEDILOL 3.125 MG PO TABS: 3.1250 mg | ORAL_TABLET | Freq: Two times a day (BID) | ORAL | 0 refills | Status: AC

## 2019-08-28 MED FILL — CARVEDILOL 3.125 MG TABLET: 3.125 | 90 days supply | Qty: 180 | Fill #0

## 2019-08-28 MED FILL — INDAPAMIDE 1.25 MG TABS: 1.25 | 30 days supply | Qty: 30 | Fill #0

## 2019-08-28 MED FILL — ATORVASTATIN 10 MG TABLET: 10 | 90 days supply | Qty: 90 | Fill #0

## 2019-08-28 NOTE — Progress Notes (Signed)
Subjective:  Patient ID: Thomas Berry, male    DOB: 1971/06/11  Age: 49 y.o. MRN: [id number]  CC: Annual Exam, Hypertension, Rash, and Hyperlipidemia  This visit occurred during the SARS-CoV-2 public health emergency.  Safety protocols were in place, including screening questions prior to the visit, additional usage of staff PPE, and extensive cleaning of exam room while observing appropriate contact time as indicated for disinfecting solutions.    HPI Thomas Berry presents for a CPX.  He complains of a chronic itchy rash around his eyes.  He wants a refill of desonide and Allegra.  He tells me his blood pressure is not adequately well controlled due to noncompliance.  He has had a few episodes of dizziness recently but he denies headache, blurred vision, chest pain, shortness of breath, palpitations, edema, or fatigue.  He has a history of elevated liver enzymes and tells me he drinks about 6 beers a day.  He denies any recent episodes of abdominal pain or icterus.  Outpatient Medications Prior to Visit  Medication Sig Dispense Refill  . Cholecalciferol 50000 units capsule Take 1 capsule (50,000 Units total) by mouth once a week. 12 capsule 1  . desonide (DESOWEN) 0.05 % lotion Apply topically 2 (two) times daily. 59 mL 3  . fexofenadine (ALLEGRA) 180 MG tablet Take 1 tablet (180 mg total) by mouth daily as needed (for allergies). 90 tablet 3  . fluticasone (FLONASE) 50 MCG/ACT nasal spray Place 2 sprays into both nostrils daily. 48 g 3  . nebivolol (BYSTOLIC) 5 MG tablet Take 1 tablet (5 mg total) by mouth daily. 90 tablet 1  . olmesartan-hydrochlorothiazide (BENICAR HCT) 40-25 MG tablet Take 1 tablet by mouth daily. 30 tablet 0   No facility-administered medications prior to visit.    ROS Review of Systems  Constitutional: Negative for chills, diaphoresis, fatigue and fever.  HENT: Negative.   Eyes: Negative for itching and visual disturbance.  Respiratory: Negative for  cough, chest tightness, shortness of breath and wheezing.   Cardiovascular: Negative for chest pain, palpitations and leg swelling.  Gastrointestinal: Negative for abdominal pain, blood in stool, constipation, diarrhea, nausea and vomiting.  Genitourinary: Negative.  Negative for difficulty urinating, dysuria, scrotal swelling and testicular pain.  Musculoskeletal: Negative for arthralgias, myalgias and neck pain.  Skin: Positive for rash. Negative for color change.  Neurological: Positive for dizziness. Negative for weakness, light-headedness and headaches.  Hematological: Negative for adenopathy. Does not bruise/bleed easily.  Psychiatric/Behavioral: Negative.     Objective:  BP (!) 172/102 (BP Location: Left Arm, Patient Position: Sitting, Cuff Size: Normal)   Pulse 83   Temp 98 F (36.7 C) (Oral)   Resp 16   Ht 5\' 9"  (1.753 m)   Wt 186 lb 6 oz (84.5 kg)   SpO2 96%   BMI 27.52 kg/m   BP Readings from Last 3 Encounters:  08/28/19 (!) 172/102  11/12/17 132/70  07/25/17 (!) 148/102    Wt Readings from Last 3 Encounters:  08/28/19 186 lb 6 oz (84.5 kg)  11/12/17 197 lb 8 oz (89.6 kg)  07/25/17 178 lb 8 oz (81 kg)    Physical Exam Vitals reviewed.  Constitutional:      Appearance: Normal appearance.  HENT:     Nose: Nose normal.     Mouth/Throat:     Mouth: Mucous membranes are moist.  Eyes:     General: No scleral icterus.    Conjunctiva/sclera: Conjunctivae normal.     Comments: There  is mild, symmetrical hyperpigmentation and scaling over both upper and lower eyelids.  Cardiovascular:     Rate and Rhythm: Normal rate and regular rhythm.     Heart sounds: No murmur.     Comments: EKG - NSR, 82 bpm No LVH Normal EKG Pulmonary:     Effort: Pulmonary effort is normal.     Breath sounds: No stridor. No wheezing, rhonchi or rales.  Abdominal:     General: Abdomen is protuberant. Bowel sounds are normal. There is no distension.     Palpations: There is no fluid  wave, hepatomegaly, splenomegaly or mass.     Tenderness: There is no abdominal tenderness.     Hernia: There is no hernia in the left inguinal area or right inguinal area.  Genitourinary:    Pubic Area: No rash.      Penis: Normal. No discharge, swelling or lesions.      Testes: Normal.        Right: Mass, tenderness or swelling not present.        Left: Tenderness or swelling not present.     Epididymis:     Right: Normal. Not inflamed or enlarged. No mass.     Left: Normal. Not inflamed or enlarged. No mass.     Prostate: Normal. Not enlarged, not tender and no nodules present.     Rectum: Normal. Guaiac result negative. No mass, tenderness, anal fissure, external hemorrhoid or internal hemorrhoid. Normal anal tone.  Musculoskeletal:        General: Normal range of motion.     Cervical back: Neck supple.     Right lower leg: No edema.     Left lower leg: No edema.  Lymphadenopathy:     Cervical: No cervical adenopathy.     Lower Body: No right inguinal adenopathy. No left inguinal adenopathy.  Skin:    General: Skin is warm and dry.     Coloration: Skin is not pale.  Neurological:     General: No focal deficit present.     Mental Status: He is alert and oriented to person, place, and time. Mental status is at baseline.  Psychiatric:        Mood and Affect: Mood normal.        Behavior: Behavior normal.     Lab Results  Component Value Date   WBC 4.5 08/28/2019   HGB 13.7 08/28/2019   HCT 39.5 08/28/2019   PLT 178.0 08/28/2019   GLUCOSE 96 08/28/2019   CHOL 152 08/28/2019   TRIG 136.0 08/28/2019   HDL 48.20 08/28/2019   LDLCALC 76 08/28/2019   ALT 36 08/28/2019   AST 60 (H) 08/28/2019   NA 137 08/28/2019   K 3.9 08/28/2019   CL 102 08/28/2019   CREATININE 0.85 08/28/2019   BUN 11 08/28/2019   CO2 28 08/28/2019   TSH 2.08 08/28/2019   PSA 1.15 08/28/2019   INR 1.1 (H) 08/28/2019   HGBA1C 4.7 07/31/2016    No results found.  Assessment & Plan:   Thomas Berry  was seen today for annual exam, hypertension, rash and hyperlipidemia.  Diagnoses and all orders for this visit:  Fatty liver disease, nonalcoholic- His liver enzymes remain mildly elevated.  I recommended lifestyle modifications and avoidance of alcohol. -     Protime-INR; Future -     Hepatic function panel; Future -     Hepatic function panel -     Protime-INR  Essential hypertension- His blood pressure is not adequately  well controlled.  His EKG is negative for LVH.  His labs are negative for secondary causes or endorgan damage.  I recommended he treat this with indapamide and carvedilol. -     CBC with Differential/Platelet; Future -     Basic metabolic panel; Future -     TSH; Future -     Urinalysis, Routine w reflex microscopic; Future -     indapamide (LOZOL) 1.25 MG tablet; Take 1 tablet (1.25 mg total) by mouth daily. -     carvedilol (COREG) 3.125 MG tablet; Take 1 tablet (3.125 mg total) by mouth 2 (two) times daily with a meal. -     EKG 12-Lead -     Urinalysis, Routine w reflex microscopic -     TSH -     Basic metabolic panel -     CBC with Differential/Platelet  Routine general medical examination at a health care facility- Exam completed, labs reviewed, he refused a flu vaccine and tetanus booster, he is referred for colon cancer screening, patient education was given. -     Lipid panel; Future -     PSA; Future -     PSA -     Lipid panel  Secondary syphilis in male- His RPR remains elevated at 1:64 over the last 2 years.  I recommended that he see infectious disease to see if this needs to be evaluated and treated. -     RPR -     HIV antibody (with reflex) -     Ambulatory referral to Infectious Disease  Vitamin D deficiency disease -     VITAMIN D 25 Hydroxy (Vit-D Deficiency, Fractures); Future -     VITAMIN D 25 Hydroxy (Vit-D Deficiency, Fractures) -     Cholecalciferol 50 MCG (2000 UT) TABS; Take 1 tablet (2,000 Units total) by mouth daily.  Screen  for colon cancer -     Ambulatory referral to Gastroenterology  Hyperlipidemia LDL goal <70- He has a mildly elevated ASCVD risk score.  I recommended that he take a statin for CV risk reduction. -     atorvastatin (LIPITOR) 10 MG tablet; Take 1 tablet (10 mg total) by mouth daily.  Eyelid eczema, unspecified laterality -     desonide (DESOWEN) 0.05 % lotion; Apply topically 2 (two) times daily. -     fexofenadine (ALLEGRA) 180 MG tablet; Take 1 tablet (180 mg total) by mouth daily as needed (for allergies).  Chronic allergic rhinitis due to pollen -     fexofenadine (ALLEGRA) 180 MG tablet; Take 1 tablet (180 mg total) by mouth daily as needed (for allergies).  Other orders -     Rpr titer -     Fluorescent treponemal ab(fta)-IgG-bld   I have discontinued Oron H. Rice's fluticasone, nebivolol, Cholecalciferol, and olmesartan-hydrochlorothiazide. I am also having him start on indapamide, carvedilol, atorvastatin, and Cholecalciferol. Additionally, I am having him maintain his desonide and fexofenadine.  Meds ordered this encounter  Medications  . indapamide (LOZOL) 1.25 MG tablet    Sig: Take 1 tablet (1.25 mg total) by mouth daily.    Dispense:  90 tablet    Refill:  0  . carvedilol (COREG) 3.125 MG tablet    Sig: Take 1 tablet (3.125 mg total) by mouth 2 (two) times daily with a meal.    Dispense:  180 tablet    Refill:  0  . atorvastatin (LIPITOR) 10 MG tablet    Sig: Take 1 tablet (10 mg  total) by mouth daily.    Dispense:  90 tablet    Refill:  1  . desonide (DESOWEN) 0.05 % lotion    Sig: Apply topically 2 (two) times daily.    Dispense:  59 mL    Refill:  3  . Cholecalciferol 50 MCG (2000 UT) TABS    Sig: Take 1 tablet (2,000 Units total) by mouth daily.    Dispense:  90 tablet    Refill:  1  . fexofenadine (ALLEGRA) 180 MG tablet    Sig: Take 1 tablet (180 mg total) by mouth daily as needed (for allergies).    Dispense:  90 tablet    Refill:  1      Follow-up: Return in about 6 weeks (around 10/09/2019).  Sanda Linger, MD

## 2019-08-28 NOTE — Patient Instructions (Signed)

## 2019-08-29 ENCOUNTER — Encounter: Payer: Self-pay | Admitting: Internal Medicine

## 2019-08-29 LAB — FLUORESCENT TREPONEMAL AB(FTA)-IGG-BLD: Fluorescent Treponemal ABS: REACTIVE — AB

## 2019-08-29 LAB — HIV ANTIBODY (ROUTINE TESTING W REFLEX): HIV 1&2 Ab, 4th Generation: NONREACTIVE

## 2019-08-29 LAB — RPR: RPR Ser Ql: REACTIVE — AB

## 2019-08-29 LAB — RPR TITER: RPR Titer: 1:64 {titer} — ABNORMAL HIGH

## 2019-08-30 ENCOUNTER — Encounter: Payer: Self-pay | Admitting: Internal Medicine

## 2019-09-01 ENCOUNTER — Telehealth: Payer: Self-pay

## 2019-09-01 NOTE — Telephone Encounter (Signed)
[facility] with [facility] called regarding patients +RPR and titer elevation that has remained the same for 2 years.   Confirmed demographics and informed of negative HIV test.

## 2019-09-03 ENCOUNTER — Ambulatory Visit: Payer: Self-pay | Admitting: Internal Medicine

## 2019-09-10 ENCOUNTER — Telehealth: Payer: Self-pay

## 2019-09-10 NOTE — Telephone Encounter (Signed)
COVID-19 Pre-Screening Questions:09/10/19  Do you currently have a fever (>100 F), chills or unexplained body aches?NO  Are you currently experiencing new cough, shortness of breath, sore throat, runny nose?NO .  Have you recently travelled outside the state of [place] in the last 14 days? NO  .  Have you been in contact with someone that is currently pending confirmation of Covid19 testing or has been confirmed to have the Covid19 virus?  NO  **If the patient answers NO to ALL questions -  advise the patient to please call the clinic before coming to the office should any symptoms develop.     

## 2019-09-11 ENCOUNTER — Ambulatory Visit (INDEPENDENT_AMBULATORY_CARE_PROVIDER_SITE_OTHER): Payer: Self-pay | Admitting: Infectious Disease

## 2019-09-11 ENCOUNTER — Other Ambulatory Visit: Payer: Self-pay

## 2019-09-11 ENCOUNTER — Encounter: Payer: Self-pay | Admitting: Infectious Disease

## 2019-09-11 ENCOUNTER — Telehealth: Payer: Self-pay | Admitting: *Deleted

## 2019-09-11 VITALS — BP 133/88 | HR 82 | Temp 98.6°F | Ht 71.0 in | Wt 182.0 lb

## 2019-09-11 DIAGNOSIS — A5149 Other secondary syphilitic conditions: Secondary | ICD-10-CM

## 2019-09-11 DIAGNOSIS — A529 Late syphilis, unspecified: Secondary | ICD-10-CM | POA: Insufficient documentation

## 2019-09-11 HISTORY — DX: Late syphilis, unspecified: A52.9

## 2019-09-11 NOTE — Telephone Encounter (Signed)
Patient without insurance at this time. He would like to have his injections given through the health department. RN spoke with Barbara Cower at [facility] who will reach out to the patient and get him scheduled for 3 weekly doses of bicillin 2.4 million units. Patient will follow up in August per Dr Doristine Counter aware that patient works 3rd shift (11pm - 7am) and is only reachable in the early morning.  Andree Coss, RN
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
## 2019-10-01 MED FILL — INDAPAMIDE 1.25 MG TABS: 1.25 | 30 days supply | Qty: 30 | Fill #1

## 2019-10-06 ENCOUNTER — Encounter: Payer: Self-pay | Admitting: Internal Medicine

## 2019-11-12 ENCOUNTER — Emergency Department (HOSPITAL_COMMUNITY)
Admission: EM | Admit: 2019-11-12 | Discharge: 2019-11-12 | Disposition: A | Discharge status: Home / Self Care | Payer: Self-pay | Attending: Emergency Medicine | Admitting: Emergency Medicine

## 2019-11-12 ENCOUNTER — Emergency Department (HOSPITAL_COMMUNITY): Payer: Self-pay

## 2019-11-12 ENCOUNTER — Encounter (HOSPITAL_COMMUNITY): Payer: Self-pay | Admitting: Emergency Medicine

## 2019-11-12 DIAGNOSIS — R933 Abnormal findings on diagnostic imaging of other parts of digestive tract: Secondary | ICD-10-CM | POA: Insufficient documentation

## 2019-11-12 DIAGNOSIS — I10 Essential (primary) hypertension: Secondary | ICD-10-CM | POA: Insufficient documentation

## 2019-11-12 DIAGNOSIS — Z79899 Other long term (current) drug therapy: Secondary | ICD-10-CM | POA: Insufficient documentation

## 2019-11-12 DIAGNOSIS — R1013 Epigastric pain: Secondary | ICD-10-CM | POA: Insufficient documentation

## 2019-11-12 DIAGNOSIS — R066 Hiccough: Secondary | ICD-10-CM | POA: Insufficient documentation

## 2019-11-12 DIAGNOSIS — E876 Hypokalemia: Secondary | ICD-10-CM | POA: Insufficient documentation

## 2019-11-12 DIAGNOSIS — R079 Chest pain, unspecified: Secondary | ICD-10-CM

## 2019-11-12 DIAGNOSIS — K229 Disease of esophagus, unspecified: Secondary | ICD-10-CM

## 2019-11-12 DIAGNOSIS — R0789 Other chest pain: Secondary | ICD-10-CM | POA: Insufficient documentation

## 2019-11-12 LAB — CBC WITH DIFFERENTIAL/PLATELET
Abs Immature Granulocytes: 0.04 10*3/uL (ref 0.00–0.07)
Basophils Absolute: 0 10*3/uL (ref 0.0–0.1)
Basophils Relative: 0 %
Eosinophils Absolute: 0.2 10*3/uL (ref 0.0–0.5)
Eosinophils Relative: 2 %
HCT: 44.3 % (ref 39.0–52.0)
Hemoglobin: 16.2 g/dL (ref 13.0–17.0)
Immature Granulocytes: 1 %
Lymphocytes Relative: 17 %
Lymphs Abs: 1.5 10*3/uL (ref 0.7–4.0)
MCH: 34.2 pg — ABNORMAL HIGH (ref 26.0–34.0)
MCHC: 36.6 g/dL — ABNORMAL HIGH (ref 30.0–36.0)
MCV: 93.5 fL (ref 80.0–100.0)
Monocytes Absolute: 1.6 10*3/uL — ABNORMAL HIGH (ref 0.1–1.0)
Monocytes Relative: 19 %
Neutro Abs: 5.5 10*3/uL (ref 1.7–7.7)
Neutrophils Relative %: 61 %
Platelets: 175 10*3/uL (ref 150–400)
RBC: 4.74 MIL/uL (ref 4.22–5.81)
RDW: 11 % — ABNORMAL LOW (ref 11.5–15.5)
WBC: 8.8 10*3/uL (ref 4.0–10.5)
nRBC: 0 % (ref 0.0–0.2)

## 2019-11-12 LAB — COMPREHENSIVE METABOLIC PANEL
ALT: 52 U/L — ABNORMAL HIGH (ref 0–44)
AST: 87 U/L — ABNORMAL HIGH (ref 15–41)
Albumin: 4.1 g/dL (ref 3.5–5.0)
Alkaline Phosphatase: 44 U/L (ref 38–126)
Anion gap: 16 — ABNORMAL HIGH (ref 5–15)
BUN: 20 mg/dL (ref 6–20)
CO2: 34 mmol/L — ABNORMAL HIGH (ref 22–32)
Calcium: 9.9 mg/dL (ref 8.9–10.3)
Chloride: 82 mmol/L — ABNORMAL LOW (ref 98–111)
Creatinine, Ser: 1.2 mg/dL (ref 0.61–1.24)
GFR calc Af Amer: >60 mL/min (ref >60)
GFR calc non Af Amer: >60 mL/min (ref >60)
Glucose, Bld: 113 mg/dL — ABNORMAL HIGH (ref 70–99)
Potassium: 2.9 mmol/L — ABNORMAL LOW (ref 3.5–5.1)
Sodium: 132 mmol/L — ABNORMAL LOW (ref 135–145)
Total Bilirubin: 1.9 mg/dL — ABNORMAL HIGH (ref 0.3–1.2)
Total Protein: 8.4 g/dL — ABNORMAL HIGH (ref 6.5–8.1)

## 2019-11-12 LAB — TROPONIN I (HIGH SENSITIVITY)
Troponin I (High Sensitivity): 4 ng/L (ref <18)
Troponin I (High Sensitivity): 6 ng/L (ref <18)

## 2019-11-12 LAB — LIPASE, BLOOD: Lipase: 44 U/L (ref 11–51)

## 2019-11-12 MED ORDER — PROCHLORPERAZINE MALEATE 10 MG PO TABS: 10.0000 mg | ORAL_TABLET | Freq: Three times a day (TID) | ORAL | 0 refills | Status: AC | PRN

## 2019-11-12 MED ORDER — HALOPERIDOL LACTATE 5 MG/ML IJ SOLN
2.0000 mg | Freq: Once | INTRAMUSCULAR | Status: AC
  Administered 2019-11-12: 2 mg via INTRAVENOUS
  Filled 2019-11-12: qty 1

## 2019-11-12 MED ORDER — POTASSIUM CHLORIDE CRYS ER 20 MEQ PO TBCR: 20.0000 meq | EXTENDED_RELEASE_TABLET | Freq: Every day | ORAL | 0 refills | Status: AC

## 2019-11-12 MED ORDER — ONDANSETRON HCL 4 MG/2ML IJ SOLN
4.0000 mg | Freq: Once | INTRAMUSCULAR | Status: AC
  Administered 2019-11-12: 4 mg via INTRAVENOUS
  Filled 2019-11-12: qty 2

## 2019-11-12 MED ORDER — IOHEXOL 300 MG/ML  SOLN
75.0000 mL | Freq: Once | INTRAMUSCULAR | Status: AC | PRN
  Administered 2019-11-12: 75 mL via INTRAVENOUS

## 2019-11-12 MED ORDER — SUCRALFATE 1 GM/10ML PO SUSP: 1.0000 g | Freq: Three times a day (TID) | ORAL | 0 refills | Status: AC

## 2019-11-12 MED ORDER — ALUM & MAG HYDROXIDE-SIMETH 200-200-20 MG/5ML PO SUSP
30.0000 mL | Freq: Once | ORAL | Status: AC
  Administered 2019-11-12: 30 mL via ORAL
  Filled 2019-11-12: qty 30

## 2019-11-12 MED ORDER — PANTOPRAZOLE SODIUM 40 MG IV SOLR
40.0000 mg | Freq: Once | INTRAVENOUS | Status: AC
  Administered 2019-11-12: 40 mg via INTRAVENOUS
  Filled 2019-11-12: qty 40

## 2019-11-12 MED ORDER — PANTOPRAZOLE SODIUM 40 MG PO TBEC: 40.0000 mg | DELAYED_RELEASE_TABLET | Freq: Every day | ORAL | 0 refills | Status: AC

## 2019-11-12 MED ORDER — POTASSIUM CHLORIDE CRYS ER 20 MEQ PO TBCR
40.0000 meq | EXTENDED_RELEASE_TABLET | Freq: Once | ORAL | Status: AC
  Administered 2019-11-12: 40 meq via ORAL
  Filled 2019-11-12: qty 2

## 2019-11-12 MED ORDER — SODIUM CHLORIDE 0.9 % IV BOLUS
1000.0000 mL | Freq: Once | INTRAVENOUS | Status: AC
  Administered 2019-11-12: 1000 mL via INTRAVENOUS

## 2019-11-12 MED FILL — INDAPAMIDE 1.25 MG TABS: 1.25 | 30 days supply | Qty: 30 | Fill #2

## 2019-11-12 NOTE — ED Triage Notes (Signed)
Patient in POV reporting he has "acid reflux" States he went on alcohol binge with his friends last week and has had hiccups and burning sensation in his throat ever since. Has tried OTC measures with no relief.

## 2019-11-12 NOTE — ED Notes (Signed)
Hiccups had eased after CT scan, now are back, the same as they were when pt came to ED. Pacing around room, completely dressed, waiting for MD to see.

## 2019-11-12 NOTE — ED Provider Notes (Signed)
[facility] EMERGENCY DEPARTMENT Provider Note   CSN: [id number] Arrival date & time: 11/12/19  7564     History Chief Complaint  Patient presents with  . Gastroesophageal Reflux    Thomas Berry is a 49 y.o. male with a history of hypertension, asthma, fatty liver disease, hyperlipidemia, & late syphilis who presents to the emergency department with complaints of acid reflux for the past 5 days.  Patient states he was doing some binge drinking for 5 days last week with his friends, the day he stopped drinking he noted that he had a lot of acid reflux.  He further describes his acid reflux as a discomfort to the epigastric area and mid chest, feels like a burning sensation, this is constant, worse with eating or drinking anything.  He is having associated hiccups as well as episodes of emesis.  Denies fever, chills, hematemesis, melena, hematochezia, dyspnea, hemoptysis, leg pain/swelling, hemoptysis, recent surgery/trauma, recent long travel, hormone use, personal hx of cancer, or hx of DVT/PE.       HPI     Past Medical History:  Diagnosis Date  . Abnormal EKG    09/20/09 LVH in limb leads  . Asthma   . ED (erectile dysfunction)   . Glaucoma   . HTN (hypertension)   . Hypertrophy of prostate with urinary obstruction and other lower urinary tract symptoms (LUTS)   . 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 Other and unspecified alcohol dependence, episodic drinking behavior   . Routine general medical examination at a health care facility     Patient Active Problem List   Diagnosis Date Noted  . 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 Screen for colon cancer 08/28/2019  . Hyperlipidemia LDL goal <70 08/28/2019  . Vitamin D deficiency disease 11/13/2017  . Spinal stenosis of lumbar region 11/20/2016  . Fatty liver disease, nonalcoholic 33/29/5188  . Routine general medical examination at a health care facility 07/01/2012  . Eyelid eczema, unspecified laterality 07/01/2012  .  Hyperglycemia 05/24/2011  . HTN (hypertension) 08/31/2010  . Allergic rhinitis 08/31/2010  . HYPERTROPHY PROSTATE W/UR OBST & OTH LUTS 07/08/2009  . Asthma 04/19/2009    History reviewed. No pertinent surgical history.     Family History  Problem Relation Age of Onset  . Hypertension Other   . Stroke Other   . Cancer Neg Hx   . Alcohol abuse Neg Hx   . Diabetes Neg Hx   . Early death Neg Hx   . Heart disease Neg Hx   . Hyperlipidemia Neg Hx   . Kidney disease Neg Hx     Social History   Tobacco Use  . Smoking status: Never Smoker  . Smokeless tobacco: Never Used  Substance Use Topics  . Alcohol use: Yes    Alcohol/week: 42.0 standard drinks    Types: 42 Cans of beer per week  . Drug use: No    Home Medications Prior to Admission medications   Medication Sig Start Date End Date Taking? Authorizing Provider  atorvastatin (LIPITOR) 10 MG tablet Take 1 tablet (10 mg total) by mouth daily. Patient not taking: Reported on 09/11/2019 08/28/19   Janith Lima, MD  carvedilol (COREG) 3.125 MG tablet Take 1 tablet (3.125 mg total) by mouth 2 (two) times daily with a meal. 08/28/19   Janith Lima, MD  Cholecalciferol 50 MCG (2000 UT) TABS Take 1 tablet (2,000 Units total) by mouth daily. Patient not taking: Reported on 09/11/2019 08/28/19   Ronnald Ramp,  Bernadene Bell, MD  desonide (DESOWEN) 0.05 % lotion Apply topically 2 (two) times daily. Patient not taking: Reported on 09/11/2019 08/28/19   Etta Grandchild, MD  fexofenadine (ALLEGRA) 180 MG tablet Take 1 tablet (180 mg total) by mouth daily as needed (for allergies). 08/28/19   Etta Grandchild, MD  indapamide (LOZOL) 1.25 MG tablet Take 1 tablet (1.25 mg total) by mouth daily. 08/28/19   Etta Grandchild, MD    Allergies    Ace inhibitors  Review of Systems   Review of Systems  Constitutional: Negative for chills and fever.  HENT:       Positive for hiccups.  Respiratory: Negative for shortness of breath.   Cardiovascular: Positive  for chest pain.  Gastrointestinal: Positive for abdominal pain, nausea and vomiting. Negative for anal bleeding, blood in stool and constipation.  Genitourinary: Negative for dysuria.  Neurological: Negative for syncope.  All other systems reviewed and are negative.   Physical Exam Updated Vital Signs BP (!) 139/94 (BP Location: Left Arm)   Pulse (!) 108   Temp 98.6 F (37 C) (Oral)   Resp 18   Ht 6' (1.829 m)   Wt 81.6 kg   SpO2 100%   BMI 24.41 kg/m   Physical Exam Vitals and nursing note reviewed.  Constitutional:      General: He is not in acute distress.    Appearance: He is well-developed. He is not toxic-appearing.     Comments: Patient intermittently hiccuping throughout exam.  HENT:     Head: Normocephalic and atraumatic.  Eyes:     General:        Right eye: No discharge.        Left eye: No discharge.     Conjunctiva/sclera: Conjunctivae normal.  Cardiovascular:     Rate and Rhythm: Regular rhythm. Tachycardia present.  Pulmonary:     Effort: Pulmonary effort is normal. No respiratory distress.     Breath sounds: Normal breath sounds. No wheezing, rhonchi or rales.  Chest:     Chest wall: No tenderness.  Abdominal:     General: There is no distension.     Palpations: Abdomen is soft.     Tenderness: There is no abdominal tenderness. There is no guarding or rebound.  Musculoskeletal:        General: No tenderness.     Cervical back: Neck supple.     Right lower leg: No edema.     Left lower leg: No edema.  Skin:    General: Skin is warm and dry.     Findings: No rash.  Neurological:     Mental Status: He is alert.     Comments: Clear speech.   Psychiatric:        Behavior: Behavior normal.     ED Results / Procedures / Treatments   Labs (all labs ordered are listed, but only abnormal results are displayed) Labs Reviewed  COMPREHENSIVE METABOLIC PANEL - Abnormal; Notable for the following components:      Result Value   Sodium 132 (*)     Potassium 2.9 (*)    Chloride 82 (*)    CO2 34 (*)    Glucose, Bld 113 (*)    Total Protein 8.4 (*)    AST 87 (*)    ALT 52 (*)    Total Bilirubin 1.9 (*)    Anion gap 16 (*)    All other components within normal limits  CBC WITH DIFFERENTIAL/PLATELET - Abnormal;  Notable for the following components:   MCH 34.2 (*)    MCHC 36.6 (*)    RDW 11.0 (*)    Monocytes Absolute 1.6 (*)    All other components within normal limits  LIPASE, BLOOD  TROPONIN I (HIGH SENSITIVITY)  TROPONIN I (HIGH SENSITIVITY)    EKG EKG Interpretation  Date/Time:  Wednesday November 12 2019 08:54:00 EDT Ventricular Rate:  106 PR Interval:    QRS Duration: 92 QT Interval:  340 QTC Calculation: 452 R Axis:   74 Text Interpretation: Sinus tachycardia Abnormal T, consider ischemia, diffuse leads Confirmed by Kennis Carina [phone]) on 11/12/2019 10:11:15 AM   Radiology CT Chest W Contrast  Result Date: 11/12/2019 CLINICAL DATA:  Epigastric pain, reflux, hiccups, nausea, vomiting EXAM: CT CHEST WITH CONTRAST TECHNIQUE: Multidetector CT imaging of the chest was performed during intravenous contrast administration. CONTRAST:  44mL OMNIPAQUE IOHEXOL 300 MG/ML  SOLN COMPARISON:  None. FINDINGS: Cardiovascular: No significant vascular findings. Normal heart size. No pericardial effusion. Mediastinum/Nodes: Numerous enlarged, somewhat matted appearing mediastinal and bilateral hilar lymph nodes, largest right hilar nodes measuring approximately 2.4 x 1.8 cm (series 3, image 88). The esophagus is fluid-filled. Indistinct soft tissue thickening of the gastroesophageal junction (series 6, image 76). Thyroid gland and trachea demonstrate no significant findings. Lungs/Pleura: Innumerable very fine centrilobular and fissural pulmonary nodules, mostly noted in the lateral segment right middle lobe and right lower lobe although also present in the left lower lobe (series 4, image 81). No pleural effusion or pneumothorax. Upper Abdomen:  No acute abnormality. Hepatic steatosis. Small, fat containing nodule of the medial limb of the right adrenal gland, consistent with incidental benign adenoma. Non nodular thickening of the left adrenal gland. Musculoskeletal: No chest wall mass or suspicious bone lesions identified. IMPRESSION: 1. The esophagus is fluid-filled and there is indistinct soft tissue thickening of the gastroesophageal junction. Findings may reflect gastroesophageal reflux and esophagitis, although obstructing malignancy is difficult to exclude. Consider direct endoscopic visualization 2. Innumerable very fine centrilobular and fissural pulmonary nodules, mostly noted in the lateral segment right middle lobe and right lower lobe although also present in the left lower lobe. 3. Numerous enlarged, somewhat matted appearing mediastinal and bilateral hilar lymph nodes. 4. The above findings are of uncertain significance as a constellation; the presence of mediastinal lymphadenopathy and fine nodules in a perilymphatic distribution is most suggestive of sarcoidosis, however atypical infection and reactive lymphadenopathy are differential considerations. Metastatic lymphadenopathy is less favored. Recommend pulmonary consultation and consideration of follow-up imaging or bronchoscopic sampling to further evaluate. 5. Hepatic steatosis. Electronically Signed   By: Lauralyn Primes M.D.   On: 11/12/2019 13:01   DG Abd Acute W/Chest  Result Date: 11/12/2019 CLINICAL DATA:  Acid reflux. Hiccups and burning cessation in throat. EXAM: DG ABDOMEN ACUTE W/ 1V CHEST COMPARISON:  Chest x-ray from 09/16/2009 FINDINGS: Normal heart size.  Fullness of the LEFT and RIGHT hilum. Lungs are clear. No sign of pleural effusion. No free air beneath either RIGHT or LEFT hemidiaphragm. Visualized skeletal structures of the bony thorax are unremarkable. Radiodense material along the RIGHT flank more lateral than expected for urinary tract, likely within fecal stream.  Mild stool in the colon. No abnormal calcification over RIGHT or LEFT renal contour. Visualized skeletal structures are unremarkable. IMPRESSION: 1. Mild fullness of the RIGHT and LEFT hilum raising the question of mild hilar nodal enlargement. Consider PA and lateral chest radiograph or chest CT on follow-up 2. No acute intra-abdominal findings. Electronically Signed  By: Donzetta KohutGeoffrey  Wile M.D.   On: 11/12/2019 09:14    Procedures Procedures (including critical care time)  Medications Ordered in ED Medications - No data to display  ED Course  I have reviewed the triage vital signs and the nursing notes.  Pertinent labs & imaging results that were available during my care of the patient were reviewed by me and considered in my medical decision making (see chart for details).  MDM Rules/Calculators/A&P                     Patient presents to the ED with complaints of acid refluxe.  Patient is nontoxic, resting comfortably, mild tachycardia noted.  He is actively hiccuping during assessment, otherwise exam is fairly unremarkable.  Additional history obtained:  Additional history obtained from chart & nursing note review.  EKG: Sinus tachycardia Abnormal T waves diffusely Lab Tests:  I Ordered, reviewed, and interpreted labs, which included:  CBC: No significant anemia or leukocytosis.  Platelets are within normal limits. CMP: Multiple mild electrolyte derangements, receiving normal saline fluids as well as potassium in the ER.  Bicarb is elevated potentially secondary to vomiting, also has what I suspect to be a mild concomitant alcoholic ketoacidosis given his binge drinking elevated anion gap.  LFTs mildly elevated likely from recent binge drinking as well. Lipase: Within normal limits Troponins: No significant elevation  Imaging Studies ordered:  I ordered imaging studies which included chest/abdominal x-ray, I independently visualized and interpreted imaging which showed 1. Mild  fullness of the RIGHT and LEFT hilum raising the question of mild hilar nodal enlargement. Consider PA and lateral chest radiograph or chest CT on follow-up 2. No acute intra-abdominal findings  Subsequent CT chest obtained: 1. The esophagus is fluid-filled and there is indistinct soft tissue thickening of the gastroesophageal junction. Findings may reflect gastroesophageal reflux and esophagitis, although obstructing malignancy is difficult to exclude. Consider direct endoscopic visualization 2. Innumerable very fine centrilobular and fissural pulmonary nodules, mostly noted in the lateral segment right middle lobe and right lower lobe although also present in the left lower lobe. 3. Numerous enlarged, somewhat matted appearing mediastinal and bilateral hilar lymph nodes. 4. The above findings are of uncertain significance as a constellation; the presence of mediastinal lymphadenopathy and fine nodules in a perilymphatic distribution is most suggestive of sarcoidosis, however atypical infection and reactive lymphadenopathy are differential considerations. Metastatic lymphadenopathy is less favored. Recommend pulmonary consultation and consideration of follow-up imaging or bronchoscopic sampling to further evaluate. 5. Hepatic steatosis.    ED Course:  Patient given fluids & potassium in the ED for dehydration & electrolyte replacement. CT scan with multiple abnormalities, patient's primary symptoms are GI, will start on PPI with Carafate to treat for esophagitis/gastritis, will also provide Compazine for hiccups.  Patient will need GI follow-up for possible endoscopy.  In regards to pulmonary findings, patient has no respiratory symptoms, no cough, dyspnea, or adventitious lung sounds, will have him follow-up with his primary care provider in this regard.   14:30: RE-EVAL: Patient is feeling much better, he is tolerating p.o., his hiccups are currently resolved, he would like to go home at this time which  seems appropriate. I discussed results, treatment plan, need for follow-up, and return precautions with the patient. Provided opportunity for questions, patient confirmed understanding and is in agreement with plan.   This is a shared visit with supervising physician Dr. Pilar PlateBero who has independently evaluated patient & provided guidance in evaluation/management/disposition, in agreement with care  Portions of this note were generated with Scientist, clinical (histocompatibility and immunogenetics). Dictation errors may occur despite best attempts at proofreading.  Final Clinical Impression(s) / ED Diagnoses Final diagnoses:  Chest pain, unspecified type  Hiccups  Esophageal abnormality  Hypokalemia    Rx / DC Orders ED Discharge Orders         Ordered    pantoprazole (PROTONIX) 40 MG tablet  Daily     11/12/19 1439    sucralfate (CARAFATE) 1 GM/10ML suspension  3 times daily with meals & bedtime     11/12/19 1439    sucralfate (CARAFATE) 1 GM/10ML suspension  3 times daily with meals & bedtime     11/12/19 1439    prochlorperazine (COMPAZINE) 10 MG tablet  Every 8 hours PRN     11/12/19 1439    potassium chloride SA (KLOR-CON) 20 MEQ tablet  Daily     11/12/19 1440           Fernande Treiber, Raymond, PA-C 11/12/19 1445    Sabas Sous, MD 11/17/19 1309

## 2019-11-12 NOTE — ED Notes (Signed)
C/o "reflux" and hiccups since Saturday, on and off, has taken OTC meds without relief.

## 2019-11-12 NOTE — Discharge Instructions (Addendum)
You were seen in the emergency department today for chest/abdominal pain and hiccups.  Your labs showed that you were somewhat dehydrated, you were given fluids for this.  Your potassium was low, we are sending you with a potassium supplement as well as diet guidelines to include potassium rich foods in your diet.  Your liver function tests were mildly elevated, this may have been due to your recent alcohol use, please have these rechecked by primary care.  Your chest CT showed some abnormalities in your esophagus as well as your lungs.  It is somewhat difficult to tell exactly what these abnormalities are, he will likely need further evaluation of these areas.   We are sending you home to treat for possible esophagitis/acid refluxe:  -Protonix: Take 1 tablet daily prior to meals -Carafate: Take prior to meals and prior to bedtime -Zofran: Take every 8 hours as needed for nausea and vomiting -Compazine: Take every 8 hours as needed for hiccuping.  We have prescribed you new medication(s) today. Discuss the medications prescribed today with your pharmacist as they can have adverse effects and interactions with your other medicines including over the counter and prescribed medications. Seek medical evaluation if you start to experience new or abnormal symptoms after taking one of these medicines, seek care immediately if you start to experience difficulty breathing, feeling of your throat closing, facial swelling, or rash as these could be indications of a more serious allergic reaction  Please follow attached diet guidelines.  Please follow-up with gastroenterology for reevaluation and possible scope to further evaluate your esophagus to see if this is more inflammation or if this could be a mass.  Please follow-up with your primary care provider to recheck your blood work abnormalities as discussed above as well as to further discuss the abnormal findings of your lungs on your CT scan.  Return to the ER  at anytime for new or worsening symptoms including but not limited to increased pain, inability to keep fluids down, fever, coughing up blood, blood in your vomit or stool, cough, trouble breathing, or any other concerns that you may have.

## 2020-01-01 ENCOUNTER — Other Ambulatory Visit: Payer: Self-pay | Admitting: Internal Medicine

## 2020-01-01 DIAGNOSIS — I10 Essential (primary) hypertension: Secondary | ICD-10-CM

## 2020-01-01 MED FILL — INDAPAMIDE 1.25 MG TABS: 1.25 | 30 days supply | Qty: 30 | Fill #0

## 2020-01-26 ENCOUNTER — Ambulatory Visit: Payer: Self-pay | Admitting: Infectious Disease

## 2020-02-06 ENCOUNTER — Other Ambulatory Visit: Payer: Self-pay | Admitting: Internal Medicine

## 2020-02-06 DIAGNOSIS — I10 Essential (primary) hypertension: Secondary | ICD-10-CM

## 2020-02-06 MED FILL — INDAPAMIDE 1.25 MG TABS: 1.25 | 30 days supply | Qty: 30 | Fill #0

## 2020-03-24 ENCOUNTER — Other Ambulatory Visit: Payer: Self-pay | Admitting: Internal Medicine

## 2020-03-24 DIAGNOSIS — I10 Essential (primary) hypertension: Secondary | ICD-10-CM

## 2020-05-24 ENCOUNTER — Ambulatory Visit: Payer: Self-pay

## 2020-12-29 ENCOUNTER — Ambulatory Visit: Payer: Self-pay

## 2020-12-29 ENCOUNTER — Other Ambulatory Visit (HOSPITAL_BASED_OUTPATIENT_CLINIC_OR_DEPARTMENT_OTHER): Payer: Self-pay

## 2021-10-16 IMAGING — CR DG ABDOMEN ACUTE W/ 1V CHEST
2 series · 2 of 2 positions shown · non-contrast
Comparison: Chest x-ray from 09/16/2009

CLINICAL DATA: Acid reflux. Hiccups and burning cessation in
throat.

EXAM:
DG ABDOMEN ACUTE W/ 1V CHEST

[chest pa]
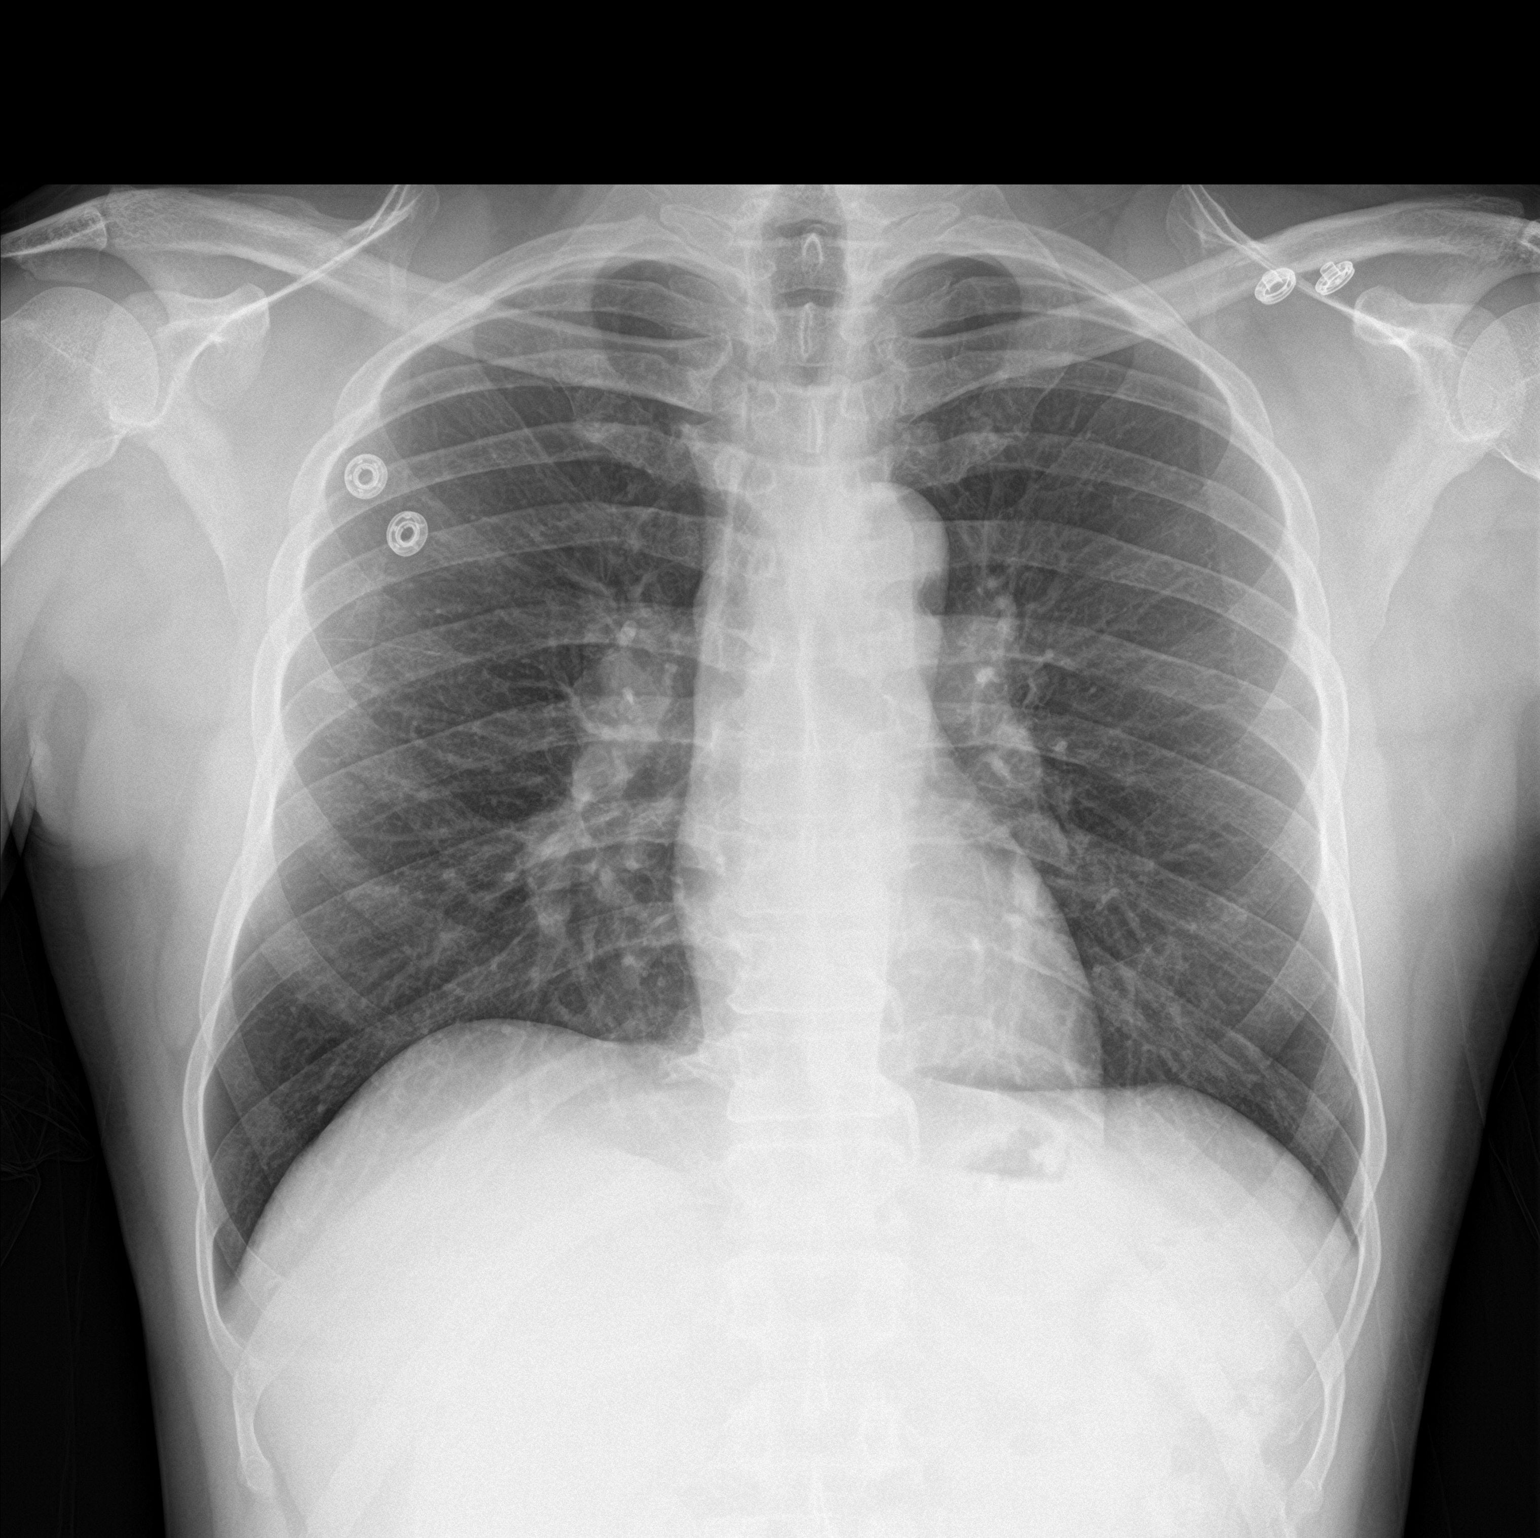

[abdomen supine]
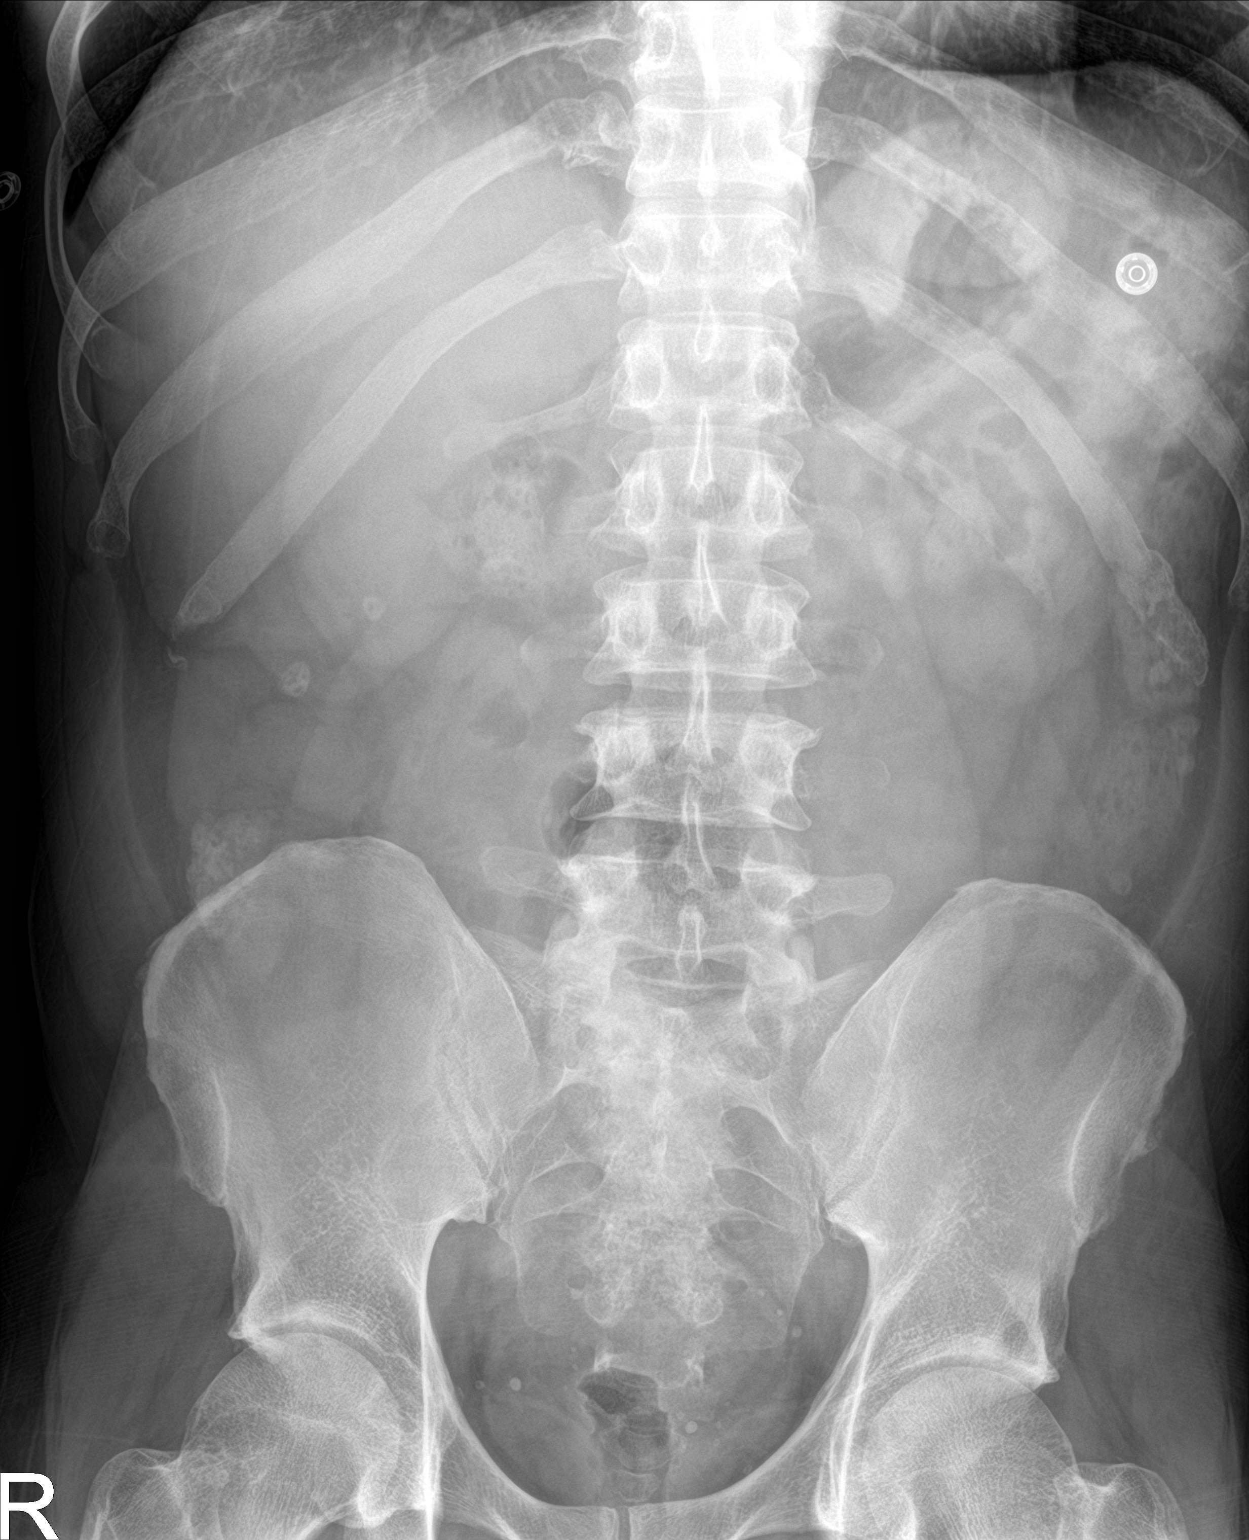

[2 of 2 positions shown; findings below may reference images not displayed]

FINDINGS: Normal heart size.  Fullness of the LEFT and RIGHT hilum.

Lungs are clear. No sign of pleural effusion. No free air beneath
either RIGHT or LEFT hemidiaphragm.

Visualized skeletal structures of the bony thorax are unremarkable.

Radiodense material along the RIGHT flank more lateral than expected
for urinary tract, likely within fecal stream. Mild stool in the
colon.

No abnormal calcification over RIGHT or LEFT renal contour.

Visualized skeletal structures are unremarkable.
IMPRESSION: 1. Mild fullness of the RIGHT and LEFT hilum raising the question of
mild hilar nodal enlargement. Consider PA and lateral chest
radiograph or chest CT on follow-up
2. No acute intra-abdominal findings.

## 2023-01-02 ENCOUNTER — Emergency Department (HOSPITAL_COMMUNITY): Payer: No Typology Code available for payment source

## 2023-01-02 ENCOUNTER — Encounter (HOSPITAL_COMMUNITY): Payer: Self-pay

## 2023-01-02 ENCOUNTER — Other Ambulatory Visit (HOSPITAL_COMMUNITY): Payer: Self-pay

## 2023-01-02 ENCOUNTER — Emergency Department (HOSPITAL_COMMUNITY)
Admission: EM | Admit: 2023-01-02 | Discharge: 2023-01-02 | Disposition: A | Discharge status: Home / Self Care | Payer: No Typology Code available for payment source | Attending: Emergency Medicine | Admitting: Emergency Medicine

## 2023-01-02 ENCOUNTER — Other Ambulatory Visit: Payer: Self-pay

## 2023-01-02 DIAGNOSIS — X509XXA Other and unspecified overexertion or strenuous movements or postures, initial encounter: Secondary | ICD-10-CM | POA: Insufficient documentation

## 2023-01-02 DIAGNOSIS — S86819A Strain of other muscle(s) and tendon(s) at lower leg level, unspecified leg, initial encounter: Secondary | ICD-10-CM

## 2023-01-02 DIAGNOSIS — S86312A Strain of muscle(s) and tendon(s) of peroneal muscle group at lower leg level, left leg, initial encounter: Secondary | ICD-10-CM | POA: Diagnosis not present

## 2023-01-02 DIAGNOSIS — M25572 Pain in left ankle and joints of left foot: Secondary | ICD-10-CM

## 2023-01-02 MED ORDER — HYDROCODONE-ACETAMINOPHEN 5-325 MG PO TABS
2.0000 | ORAL_TABLET | ORAL | 0 refills | Status: AC | PRN
  Filled 2023-01-02: qty 8, 1d supply, fill #0

## 2023-01-02 NOTE — ED Provider Notes (Signed)
[place] EMERGENCY DEPARTMENT AT [facility] Provider Note   CSN: [id number] Arrival date & time: 01/02/23  2376     History  Chief Complaint  Patient presents with   Ankle Pain    JOHNATHAN HESKETT is a 52 y.o. male.  52 year old male presents with his wife for evaluation of left ankle pain.  He states he went to stand up last night from bed when he heard a loud pop.  Since then he has not been able to bear weight.  Reports he feels something popping in and out over the left lateral malleolus.  Denies other complaints.  Denies trauma to the ankle.  The history is provided by the patient. No language interpreter was used.       Home Medications Prior to Admission medications   Medication Sig Start Date End Date Taking? Authorizing Provider  albuterol (VENTOLIN HFA) 108 (90 Base) MCG/ACT inhaler Inhale 2 puffs into the lungs every 6 (six) hours as needed for shortness of breath. 08/18/19   [provider]  calcium carbonate (TUMS - DOSED IN MG ELEMENTAL CALCIUM) 500 MG chewable tablet Chew 3 tablets by mouth daily as needed for indigestion or heartburn.    [provider]  carvedilol (COREG) 3.125 MG tablet Take 1 tablet (3.125 mg total) by mouth 2 (two) times daily with a meal. 08/28/19   Etta Grandchild, MD  famotidine (PEPCID) 20 MG tablet Take 20 mg by mouth daily.    [provider]  fexofenadine (ALLEGRA) 180 MG tablet Take 1 tablet (180 mg total) by mouth daily as needed (for allergies). 08/28/19   Etta Grandchild, MD  ibuprofen (ADVIL) 200 MG tablet Take 400-800 mg by mouth every 6 (six) hours as needed for moderate pain.    [provider]  indapamide (LOZOL) 1.25 MG tablet TAKE 1 TABLET (1.25 MG TOTAL) BY MOUTH DAILY. 02/06/20   Etta Grandchild, MD  pantoprazole (PROTONIX) 40 MG tablet Take 1 tablet (40 mg total) by mouth daily. 11/12/19   Petrucelli, Samantha R, PA-C  potassium chloride SA (KLOR-CON) 20 MEQ tablet Take 1 tablet (20  mEq total) by mouth daily. 11/12/19   Petrucelli, Samantha R, PA-C  prochlorperazine (COMPAZINE) 10 MG tablet Take 1 tablet (10 mg total) by mouth every 8 (eight) hours as needed for nausea or vomiting. 11/12/19   Petrucelli, Samantha R, PA-C  sucralfate (CARAFATE) 1 GM/10ML suspension Take 10 mLs (1 g total) by mouth 4 (four) times daily -  with meals and at bedtime. 11/12/19   Petrucelli, Samantha R, PA-C  sucralfate (CARAFATE) 1 GM/10ML suspension Take 10 mLs (1 g total) by mouth 4 (four) times daily -  with meals and at bedtime. 11/12/19   Petrucelli, Samantha R, PA-C  atorvastatin (LIPITOR) 10 MG tablet Take 1 tablet (10 mg total) by mouth daily. Patient not taking: Reported on 09/11/2019 08/28/19 11/12/19  Etta Grandchild, MD      Allergies    Ace inhibitors    Review of Systems   Review of Systems  Musculoskeletal:  Negative for arthralgias.  All other systems reviewed and are negative.   Physical Exam Updated Vital Signs BP (!) 161/102   Pulse 78   Temp 98 F (36.7 C) (Oral)   Resp 18   Ht 6' (1.829 m)   Wt 81.6 kg   SpO2 95%   BMI 24.40 kg/m  Physical Exam Vitals and nursing note reviewed.  Constitutional:  General: He is not in acute distress.    Appearance: Normal appearance. He is not ill-appearing.  HENT:     Head: Normocephalic and atraumatic.     Nose: Nose normal.  Eyes:     Conjunctiva/sclera: Conjunctivae normal.  Pulmonary:     Effort: Pulmonary effort is normal. No respiratory distress.  Musculoskeletal:        General: No deformity.     Comments: Left ankle with good range of motion.  Without tenderness to palpation.  Neurovascularly intact.  With resisted plantarflexion patient's tendon of the lateral malleolus do come out of place.  Easily pushed back into the groove.  Skin:    Findings: No rash.  Neurological:     Mental Status: He is alert.     ED Results / Procedures / Treatments   Labs (all labs ordered are listed, but only abnormal results are  displayed) Labs Reviewed - No data to display  EKG None  Radiology DG Foot Complete Left  Result Date: 01/02/2023 CLINICAL DATA:  Acute left foot pain without known injury. EXAM: LEFT FOOT - COMPLETE 3+ VIEW COMPARISON:  None Available. FINDINGS: There is no evidence of fracture or dislocation. There is no evidence of arthropathy or other focal bone abnormality. Soft tissues are unremarkable. IMPRESSION: Negative. Electronically Signed   By: Lupita Raider M.D.   On: 01/02/2023 08:08    Procedures Procedures    Medications Ordered in ED Medications - No data to display  ED Course/ Medical Decision Making/ A&P                             Medical Decision Making Amount and/or Complexity of Data Reviewed Radiology: ordered.   52 year old male presents today for left ankle pain.  On exam it appears as if he tore the superior peroneal retinaculum.  The tendons do pop out of the socket with resisted range of motion.  He is unable to bear weight.  Will provide with walking boot, crutches, orthopedic follow-up.  Will give short course of pain medication to keep on hand for severe or breakthrough pain.  Discussed with Earney Hamburg, PA-C with orthopedics who agrees with outpatient follow-up.  Patient voices understanding and is in agreement with plan.  Discharged in stable condition.   Final Clinical Impression(s) / ED Diagnoses Final diagnoses:  Acute left ankle pain  Tear of peroneal retinaculum    Rx / DC Orders ED Discharge Orders          Ordered    HYDROcodone-acetaminophen (NORCO/VICODIN) 5-325 MG tablet  Every 4 hours PRN        01/02/23 0824              Marita Kansas, PA-C 01/02/23 0825    Arby Barrette, MD 01/02/23 [phone]

## 2023-01-02 NOTE — ED Triage Notes (Signed)
Pt states he started walking to get into shower last night and felt a "pop" on the outer left ankle. Pt states it's painful. Pt has 1+ swelling on outer malleolus of left foot. The area is erythematous. Pt has 2+ left pedal pulse, cap refill less than 3 sec, warm to touch.

## 2023-01-02 NOTE — Progress Notes (Signed)
Orthopedic Tech Progress Note Patient Details:  EPIFANIO LABRADOR [birth year] [id number]  Ortho Devices Type of Ortho Device: CAM walker, Crutches Ortho Device/Splint Location: LLE Ortho Device/Splint Interventions: Ordered, Application, Adjustment   Post Interventions Patient Tolerated: Well, Ambulated well Instructions Provided: Care of device  Donald Pore 01/02/2023, 9:04 AM

## 2023-01-02 NOTE — Discharge Instructions (Signed)
You likely had a tear of your retinaculum that holds the tendons in place.  You were given a walking boot, crutches.  Short course of pain medication sent into your pharmacy but primarily use Tylenol and ibuprofen.  Reserve the pain medication for severe breakthrough pain.  Follow-up with the orthopedist.  For any concerning symptoms return to the emergency department.

## 2023-01-12 ENCOUNTER — Other Ambulatory Visit (HOSPITAL_COMMUNITY): Payer: Self-pay

## 2023-05-12 ENCOUNTER — Encounter (HOSPITAL_COMMUNITY): Payer: Self-pay

## 2023-05-12 ENCOUNTER — Emergency Department (HOSPITAL_COMMUNITY)
Admission: EM | Admit: 2023-05-12 | Discharge: 2023-05-12 | Disposition: A | Discharge status: Home / Self Care | Payer: Non-veteran care | Attending: Emergency Medicine | Admitting: Emergency Medicine

## 2023-05-12 ENCOUNTER — Other Ambulatory Visit: Payer: Self-pay

## 2023-05-12 DIAGNOSIS — I10 Essential (primary) hypertension: Secondary | ICD-10-CM | POA: Insufficient documentation

## 2023-05-12 DIAGNOSIS — J45909 Unspecified asthma, uncomplicated: Secondary | ICD-10-CM | POA: Diagnosis not present

## 2023-05-12 DIAGNOSIS — Z79899 Other long term (current) drug therapy: Secondary | ICD-10-CM | POA: Diagnosis not present

## 2023-05-12 DIAGNOSIS — Z202 Contact with and (suspected) exposure to infections with a predominantly sexual mode of transmission: Secondary | ICD-10-CM | POA: Insufficient documentation

## 2023-05-12 LAB — URINALYSIS, ROUTINE W REFLEX MICROSCOPIC
Bilirubin Urine: NEGATIVE
Glucose, UA: NEGATIVE mg/dL
Hgb urine dipstick: NEGATIVE
Ketones, ur: NEGATIVE mg/dL
Leukocytes,Ua: NEGATIVE
Nitrite: NEGATIVE
Protein, ur: NEGATIVE mg/dL
Specific Gravity, Urine: 1.009 (ref 1.005–1.030)
pH: 6 (ref 5.0–8.0)

## 2023-05-12 MED ORDER — METRONIDAZOLE 500 MG PO TABS: 500.0000 mg | ORAL_TABLET | Freq: Two times a day (BID) | ORAL | 0 refills | Status: AC

## 2023-05-12 NOTE — ED Provider Notes (Signed)
[place] [facility]GENCY DEPARTMENT AT [facility] Provider Note   CSN: [id number] Arrival date & time: 05/12/23  1246     History  Chief Complaint  Patient presents with   Exposure to STD    Thomas Berry is a 52 y.o. male.  With history of hypertension, asthma, secondary syphilis presenting to the ED for evaluation of potential STD exposure.  States his significant other was tested and found to have trichomonas approximately 1 week ago.  She is currently on metronidazole.  He states a few months ago he had intercourse with a another male.  He denies symptoms at this time.  Specifically denies abdominal pain, penile or testicular pain, pubic rashes, penile lesions, fevers, chills, nausea, vomiting, penile discharge.  He states he was tested for HIV and syphilis recently.   Exposure to STD       Home Medications Prior to Admission medications   Medication Sig Start Date End Date Taking? Authorizing Provider  metroNIDAZOLE (FLAGYL) 500 MG tablet Take 1 tablet (500 mg total) by mouth 2 (two) times daily. 05/12/23  Yes Jadarius Commons, Edsel Petrin, PA-C  albuterol (VENTOLIN HFA) 108 (90 Base) MCG/ACT inhaler Inhale 2 puffs into the lungs every 6 (six) hours as needed for shortness of breath. 08/18/19   [provider]  calcium carbonate (TUMS - DOSED IN MG ELEMENTAL CALCIUM) 500 MG chewable tablet Chew 3 tablets by mouth daily as needed for indigestion or heartburn.    [provider]  carvedilol (COREG) 3.125 MG tablet Take 1 tablet (3.125 mg total) by mouth 2 (two) times daily with a meal. 08/28/19   Etta Grandchild, MD  famotidine (PEPCID) 20 MG tablet Take 20 mg by mouth daily.    [provider]  fexofenadine (ALLEGRA) 180 MG tablet Take 1 tablet (180 mg total) by mouth daily as needed (for allergies). 08/28/19   Etta Grandchild, MD  HYDROcodone-acetaminophen (NORCO/VICODIN) 5-325 MG tablet Take 2 tablets by mouth every 4 (four) hours as needed. 01/02/23    Marita Kansas, PA-C  ibuprofen (ADVIL) 200 MG tablet Take 400-800 mg by mouth every 6 (six) hours as needed for moderate pain.    [provider]  indapamide (LOZOL) 1.25 MG tablet TAKE 1 TABLET (1.25 MG TOTAL) BY MOUTH DAILY. 02/06/20   Etta Grandchild, MD  pantoprazole (PROTONIX) 40 MG tablet Take 1 tablet (40 mg total) by mouth daily. 11/12/19   Petrucelli, Samantha R, PA-C  potassium chloride SA (KLOR-CON) 20 MEQ tablet Take 1 tablet (20 mEq total) by mouth daily. 11/12/19   Petrucelli, Samantha R, PA-C  prochlorperazine (COMPAZINE) 10 MG tablet Take 1 tablet (10 mg total) by mouth every 8 (eight) hours as needed for nausea or vomiting. 11/12/19   Petrucelli, Samantha R, PA-C  sucralfate (CARAFATE) 1 GM/10ML suspension Take 10 mLs (1 g total) by mouth 4 (four) times daily -  with meals and at bedtime. 11/12/19   Petrucelli, Samantha R, PA-C  sucralfate (CARAFATE) 1 GM/10ML suspension Take 10 mLs (1 g total) by mouth 4 (four) times daily -  with meals and at bedtime. 11/12/19   Petrucelli, Samantha R, PA-C  atorvastatin (LIPITOR) 10 MG tablet Take 1 tablet (10 mg total) by mouth daily. Patient not taking: Reported on 09/11/2019 08/28/19 11/12/19  Etta Grandchild, MD      Allergies    Ace inhibitors    Review of Systems   Review of Systems  All other systems reviewed and are negative.  Physical Exam Updated Vital Signs BP (!) 141/93 (BP Location: Right Arm)   Pulse 98   Temp 98.9 F (37.2 C) (Oral)   Resp 18   Ht 6' (1.829 m)   Wt 87.1 kg   SpO2 98%   BMI 26.04 kg/m  Physical Exam Vitals and nursing note reviewed.  Constitutional:      General: He is not in acute distress.    Appearance: Normal appearance. He is normal weight. He is not ill-appearing.  HENT:     Head: Normocephalic and atraumatic.  Pulmonary:     Effort: Pulmonary effort is normal. No respiratory distress.  Abdominal:     General: Abdomen is flat.  Genitourinary:    Comments: GU exam declined Musculoskeletal:         General: Normal range of motion.     Cervical back: Neck supple.  Skin:    General: Skin is warm and dry.  Neurological:     Mental Status: He is alert and oriented to person, place, and time.  Psychiatric:        Mood and Affect: Mood normal.        Behavior: Behavior normal.     ED Results / Procedures / Treatments   Labs (all labs ordered are listed, but only abnormal results are displayed) Labs Reviewed  URINALYSIS, ROUTINE W REFLEX MICROSCOPIC - Abnormal; Notable for the following components:      Result Value   Color, Urine STRAW (*)    All other components within normal limits  GC/CHLAMYDIA PROBE AMP ([place]) NOT AT [facility]  URINE CYTOLOGY ANCILLARY ONLY    EKG None  Radiology No results found.  Procedures Procedures    Medications Ordered in ED Medications - No data to display  ED Course/ Medical Decision Making/ A&P                                 Medical Decision Making Amount and/or Complexity of Data Reviewed Labs: ordered.  This patient presents to the ED for concern of STD exposure  My initial workup includes STI panel.  Patient declines RPR and HIV testing  Additional history obtained from: Nursing notes from this visit. Significant other at bedside provides a personal history  I ordered, reviewed and interpreted labs which include: Urinalysis, GC/chlamydia, urine trichomonas  Afebrile, hemodynamically stable.  52 year old male presenting for evaluation of potential exposure to an STD.  His significant other was positive for trichomonas 1 week ago.  He is asymptomatic.  Requesting treatment so he does not continue bouncing the infection back-and-forth.  Believe this is reasonable and will treat with metronidazole.  GC/chlamydia and trichomonas testing are sent.  He was encouraged to follow-up on his MyChart for these results.  He declined HIV and syphilis testing.  He declined GU exam.  He was encouraged to follow-up with his primary care  provider if he is positive for any of the test.  He was given return precautions.  Stable at discharge.  At this time there does not appear to be any evidence of an acute emergency medical condition and the patient appears stable for discharge with appropriate outpatient follow up. Diagnosis was discussed with patient who verbalizes understanding of care plan and is agreeable to discharge. I have discussed return precautions with patient and significant other who verbalizes understanding. Patient encouraged to follow-up with their PCP within 1 week. All questions answered.  Note: Portions  of this report may have been transcribed using voice recognition software. Every effort was made to ensure accuracy; however, inadvertent computerized transcription errors may still be present.        Final Clinical Impression(s) / ED Diagnoses Final diagnoses:  STD exposure    Rx / DC Orders ED Discharge Orders          Ordered    metroNIDAZOLE (FLAGYL) 500 MG tablet  2 times daily        05/12/23 1327              Michelle Piper, Cordelia Poche 05/12/23 1328    Terrilee Files, MD 05/13/23 1004

## 2023-05-12 NOTE — Discharge Instructions (Signed)
You have been seen today for your complaint of STD exposure. Your lab work is pending and will be available to review on your MyChart and 2 to 3 days. Your discharge medications include metronidazole. This is an antibiotic. You should take it as prescribed. You should take it for the entire duration of the prescription. This may cause an upset stomach. This is normal. You may take this with food. You may also eat yogurt to prevent diarrhea.  You cannot drink alcohol while taking this medication as it may cause significant vomiting. Follow up with: Your primary care provider if you are positive for any of the tests Please seek immediate medical care if you develop any of the following symptoms: Any new or worsening symptoms At this time there does not appear to be the presence of an emergent medical condition, however there is always the potential for conditions to change. Please read and follow the below instructions.  Do not take your medicine if  develop an itchy rash, swelling in your mouth or lips, or difficulty breathing; call 911 and seek immediate emergency medical attention if this occurs.  You may review your lab tests and imaging results in their entirety on your MyChart account.  Please discuss all results of fully with your primary care provider and other specialist at your follow-up visit.  Note: Portions of this text may have been transcribed using voice recognition software. Every effort was made to ensure accuracy; however, inadvertent computerized transcription errors may still be present.

## 2023-05-12 NOTE — ED Triage Notes (Signed)
Patient reports he was exposed to STDs, unable to tell when. Denies symptoms.

## 2023-05-14 LAB — GC/CHLAMYDIA PROBE AMP (~~LOC~~) NOT AT ARMC
Chlamydia: NEGATIVE
Comment: NEGATIVE
Comment: NORMAL
Neisseria Gonorrhea: NEGATIVE

## 2023-05-16 LAB — URINE CYTOLOGY ANCILLARY ONLY
Comment: NEGATIVE
Trichomonas: NEGATIVE
